# Patient Record
Sex: Female | Born: 1970 | Race: White | Hispanic: No | Marital: Single | Smoking: Current every day smoker
Health system: Southern US, Community
[De-identification: ages and names within clinical notes are randomized; demographics above are authoritative.]

## PROBLEM LIST (undated history)

## (undated) DIAGNOSIS — F419 Anxiety disorder, unspecified: Secondary | ICD-10-CM

## (undated) DIAGNOSIS — S2249XA Multiple fractures of ribs, unspecified side, initial encounter for closed fracture: Secondary | ICD-10-CM

## (undated) DIAGNOSIS — F32A Depression, unspecified: Secondary | ICD-10-CM

## (undated) DIAGNOSIS — G8929 Other chronic pain: Secondary | ICD-10-CM

## (undated) DIAGNOSIS — M549 Dorsalgia, unspecified: Secondary | ICD-10-CM

## (undated) DIAGNOSIS — S02609A Fracture of mandible, unspecified, initial encounter for closed fracture: Secondary | ICD-10-CM

## (undated) DIAGNOSIS — M79606 Pain in leg, unspecified: Secondary | ICD-10-CM

## (undated) DIAGNOSIS — R569 Unspecified convulsions: Secondary | ICD-10-CM

## (undated) DIAGNOSIS — S2239XA Fracture of one rib, unspecified side, initial encounter for closed fracture: Secondary | ICD-10-CM

## (undated) DIAGNOSIS — IMO0002 Reserved for concepts with insufficient information to code with codable children: Secondary | ICD-10-CM

## (undated) DIAGNOSIS — F329 Major depressive disorder, single episode, unspecified: Secondary | ICD-10-CM

## (undated) DIAGNOSIS — F4310 Post-traumatic stress disorder, unspecified: Secondary | ICD-10-CM

## (undated) HISTORY — PX: OTHER SURGICAL HISTORY: SHX169

---

## 1998-04-05 ENCOUNTER — Emergency Department (HOSPITAL_COMMUNITY): Admission: EM | Admit: 1998-04-05 | Discharge: 1998-04-05 | Payer: Self-pay | Admitting: Emergency Medicine

## 1998-04-06 ENCOUNTER — Emergency Department (HOSPITAL_COMMUNITY): Admission: EM | Admit: 1998-04-06 | Discharge: 1998-04-06 | Payer: Self-pay | Admitting: Emergency Medicine

## 1998-05-07 ENCOUNTER — Other Ambulatory Visit: Admission: RE | Admit: 1998-05-07 | Discharge: 1998-05-07 | Payer: Self-pay | Admitting: Obstetrics

## 1998-05-07 ENCOUNTER — Encounter: Admission: RE | Admit: 1998-05-07 | Discharge: 1998-05-07 | Payer: Self-pay | Admitting: Obstetrics

## 1998-06-03 ENCOUNTER — Inpatient Hospital Stay (HOSPITAL_COMMUNITY): Admission: AD | Admit: 1998-06-03 | Discharge: 1998-06-03 | Payer: Self-pay | Admitting: Obstetrics

## 1998-06-14 ENCOUNTER — Emergency Department (HOSPITAL_COMMUNITY): Admission: EM | Admit: 1998-06-14 | Discharge: 1998-06-14 | Payer: Self-pay | Admitting: Emergency Medicine

## 1998-06-24 ENCOUNTER — Emergency Department (HOSPITAL_COMMUNITY): Admission: EM | Admit: 1998-06-24 | Discharge: 1998-06-24 | Payer: Self-pay | Admitting: Emergency Medicine

## 1998-06-26 ENCOUNTER — Emergency Department (HOSPITAL_COMMUNITY): Admission: EM | Admit: 1998-06-26 | Discharge: 1998-06-26 | Payer: Self-pay | Admitting: Emergency Medicine

## 1998-06-30 ENCOUNTER — Emergency Department (HOSPITAL_COMMUNITY): Admission: EM | Admit: 1998-06-30 | Discharge: 1998-06-30 | Payer: Self-pay | Admitting: Emergency Medicine

## 1998-07-05 ENCOUNTER — Emergency Department (HOSPITAL_COMMUNITY): Admission: EM | Admit: 1998-07-05 | Discharge: 1998-07-05 | Payer: Self-pay | Admitting: Emergency Medicine

## 1998-07-12 ENCOUNTER — Emergency Department (HOSPITAL_COMMUNITY): Admission: EM | Admit: 1998-07-12 | Discharge: 1998-07-12 | Payer: Self-pay

## 1998-07-13 ENCOUNTER — Emergency Department (HOSPITAL_COMMUNITY): Admission: EM | Admit: 1998-07-13 | Discharge: 1998-07-13 | Payer: Self-pay | Admitting: Emergency Medicine

## 1998-08-26 ENCOUNTER — Encounter: Admission: RE | Admit: 1998-08-26 | Discharge: 1998-08-26 | Payer: Self-pay | Admitting: Family Medicine

## 1998-09-23 ENCOUNTER — Emergency Department (HOSPITAL_COMMUNITY): Admission: EM | Admit: 1998-09-23 | Discharge: 1998-09-23 | Payer: Self-pay | Admitting: Emergency Medicine

## 1998-09-29 ENCOUNTER — Inpatient Hospital Stay (HOSPITAL_COMMUNITY): Admission: EM | Admit: 1998-09-29 | Discharge: 1998-10-01 | Payer: Self-pay | Admitting: Emergency Medicine

## 1998-10-13 ENCOUNTER — Encounter: Admission: RE | Admit: 1998-10-13 | Discharge: 1998-10-13 | Payer: Self-pay | Admitting: Family Medicine

## 1998-10-26 ENCOUNTER — Encounter: Admission: RE | Admit: 1998-10-26 | Discharge: 1998-10-26 | Payer: Self-pay | Admitting: Family Medicine

## 1998-11-02 ENCOUNTER — Encounter: Admission: RE | Admit: 1998-11-02 | Discharge: 1998-11-02 | Payer: Self-pay | Admitting: Family Medicine

## 1998-11-04 ENCOUNTER — Emergency Department (HOSPITAL_COMMUNITY): Admission: EM | Admit: 1998-11-04 | Discharge: 1998-11-04 | Payer: Self-pay | Admitting: Emergency Medicine

## 1998-12-07 ENCOUNTER — Encounter: Admission: RE | Admit: 1998-12-07 | Discharge: 1998-12-07 | Payer: Self-pay | Admitting: Family Medicine

## 1998-12-16 ENCOUNTER — Encounter: Admission: RE | Admit: 1998-12-16 | Discharge: 1998-12-16 | Payer: Self-pay | Admitting: Sports Medicine

## 1998-12-22 ENCOUNTER — Emergency Department (HOSPITAL_COMMUNITY): Admission: EM | Admit: 1998-12-22 | Discharge: 1998-12-23 | Payer: Self-pay | Admitting: Emergency Medicine

## 1999-01-03 ENCOUNTER — Emergency Department (HOSPITAL_COMMUNITY): Admission: EM | Admit: 1999-01-03 | Discharge: 1999-01-04 | Payer: Self-pay | Admitting: Internal Medicine

## 1999-03-02 ENCOUNTER — Encounter: Admission: RE | Admit: 1999-03-02 | Discharge: 1999-03-02 | Payer: Self-pay | Admitting: Sports Medicine

## 1999-03-05 ENCOUNTER — Emergency Department (HOSPITAL_COMMUNITY): Admission: EM | Admit: 1999-03-05 | Discharge: 1999-03-06 | Payer: Self-pay | Admitting: Emergency Medicine

## 1999-03-10 ENCOUNTER — Emergency Department (HOSPITAL_COMMUNITY): Admission: EM | Admit: 1999-03-10 | Discharge: 1999-03-10 | Payer: Self-pay | Admitting: *Deleted

## 1999-03-13 ENCOUNTER — Emergency Department (HOSPITAL_COMMUNITY): Admission: EM | Admit: 1999-03-13 | Discharge: 1999-03-13 | Payer: Self-pay | Admitting: Emergency Medicine

## 1999-03-18 ENCOUNTER — Encounter: Admission: RE | Admit: 1999-03-18 | Discharge: 1999-03-18 | Payer: Self-pay | Admitting: Family Medicine

## 1999-04-15 ENCOUNTER — Emergency Department (HOSPITAL_COMMUNITY): Admission: EM | Admit: 1999-04-15 | Discharge: 1999-04-15 | Payer: Self-pay | Admitting: *Deleted

## 1999-04-22 ENCOUNTER — Emergency Department (HOSPITAL_COMMUNITY): Admission: EM | Admit: 1999-04-22 | Discharge: 1999-04-22 | Payer: Self-pay | Admitting: Emergency Medicine

## 1999-05-17 ENCOUNTER — Encounter: Admission: RE | Admit: 1999-05-17 | Discharge: 1999-05-17 | Payer: Self-pay | Admitting: Family Medicine

## 1999-05-26 ENCOUNTER — Encounter: Admission: RE | Admit: 1999-05-26 | Discharge: 1999-05-26 | Payer: Self-pay | Admitting: Family Medicine

## 1999-06-16 ENCOUNTER — Encounter: Payer: Self-pay | Admitting: Emergency Medicine

## 1999-06-16 ENCOUNTER — Emergency Department (HOSPITAL_COMMUNITY): Admission: EM | Admit: 1999-06-16 | Discharge: 1999-06-16 | Payer: Self-pay | Admitting: Emergency Medicine

## 1999-08-03 ENCOUNTER — Encounter: Admission: RE | Admit: 1999-08-03 | Discharge: 1999-08-03 | Payer: Self-pay | Admitting: Family Medicine

## 1999-08-04 ENCOUNTER — Encounter: Admission: RE | Admit: 1999-08-04 | Discharge: 1999-08-04 | Payer: Self-pay | Admitting: Family Medicine

## 1999-08-05 ENCOUNTER — Encounter: Admission: RE | Admit: 1999-08-05 | Discharge: 1999-08-05 | Payer: Self-pay | Admitting: Family Medicine

## 1999-08-09 ENCOUNTER — Encounter: Admission: RE | Admit: 1999-08-09 | Discharge: 1999-08-09 | Payer: Self-pay | Admitting: Family Medicine

## 1999-08-30 ENCOUNTER — Emergency Department (HOSPITAL_COMMUNITY): Admission: EM | Admit: 1999-08-30 | Discharge: 1999-08-30 | Payer: Self-pay | Admitting: Emergency Medicine

## 1999-09-02 ENCOUNTER — Encounter: Payer: Self-pay | Admitting: Family Medicine

## 1999-09-02 ENCOUNTER — Inpatient Hospital Stay (HOSPITAL_COMMUNITY): Admission: EM | Admit: 1999-09-02 | Discharge: 1999-09-04 | Payer: Self-pay | Admitting: Emergency Medicine

## 1999-09-28 ENCOUNTER — Encounter: Admission: RE | Admit: 1999-09-28 | Discharge: 1999-09-28 | Payer: Self-pay | Admitting: Family Medicine

## 1999-09-28 ENCOUNTER — Emergency Department (HOSPITAL_COMMUNITY): Admission: EM | Admit: 1999-09-28 | Discharge: 1999-09-29 | Payer: Self-pay | Admitting: Emergency Medicine

## 1999-10-04 ENCOUNTER — Emergency Department (HOSPITAL_COMMUNITY): Admission: EM | Admit: 1999-10-04 | Discharge: 1999-10-04 | Payer: Self-pay | Admitting: Emergency Medicine

## 1999-10-20 ENCOUNTER — Encounter: Payer: Self-pay | Admitting: Emergency Medicine

## 1999-10-20 ENCOUNTER — Emergency Department (HOSPITAL_COMMUNITY): Admission: EM | Admit: 1999-10-20 | Discharge: 1999-10-20 | Payer: Self-pay | Admitting: Emergency Medicine

## 1999-10-24 ENCOUNTER — Emergency Department (HOSPITAL_COMMUNITY): Admission: EM | Admit: 1999-10-24 | Discharge: 1999-10-24 | Payer: Self-pay | Admitting: *Deleted

## 1999-11-19 ENCOUNTER — Emergency Department (HOSPITAL_COMMUNITY): Admission: EM | Admit: 1999-11-19 | Discharge: 1999-11-19 | Payer: Self-pay

## 1999-11-19 ENCOUNTER — Inpatient Hospital Stay (HOSPITAL_COMMUNITY): Admission: AD | Admit: 1999-11-19 | Discharge: 1999-11-19 | Payer: Self-pay | Admitting: *Deleted

## 1999-11-28 ENCOUNTER — Emergency Department (HOSPITAL_COMMUNITY): Admission: EM | Admit: 1999-11-28 | Discharge: 1999-11-28 | Payer: Self-pay | Admitting: Emergency Medicine

## 1999-11-28 ENCOUNTER — Encounter: Payer: Self-pay | Admitting: Emergency Medicine

## 1999-12-09 ENCOUNTER — Encounter: Admission: RE | Admit: 1999-12-09 | Discharge: 1999-12-09 | Payer: Self-pay | Admitting: Family Medicine

## 1999-12-10 ENCOUNTER — Emergency Department (HOSPITAL_COMMUNITY): Admission: EM | Admit: 1999-12-10 | Discharge: 1999-12-10 | Payer: Self-pay | Admitting: Emergency Medicine

## 1999-12-11 ENCOUNTER — Emergency Department (HOSPITAL_COMMUNITY): Admission: EM | Admit: 1999-12-11 | Discharge: 1999-12-11 | Payer: Self-pay

## 1999-12-17 ENCOUNTER — Encounter: Admission: RE | Admit: 1999-12-17 | Discharge: 1999-12-17 | Payer: Self-pay | Admitting: Sports Medicine

## 1999-12-23 ENCOUNTER — Emergency Department (HOSPITAL_COMMUNITY): Admission: EM | Admit: 1999-12-23 | Discharge: 1999-12-23 | Payer: Self-pay | Admitting: Emergency Medicine

## 2000-01-10 ENCOUNTER — Emergency Department (HOSPITAL_COMMUNITY): Admission: EM | Admit: 2000-01-10 | Discharge: 2000-01-10 | Payer: Self-pay | Admitting: Emergency Medicine

## 2000-01-13 ENCOUNTER — Encounter: Admission: RE | Admit: 2000-01-13 | Discharge: 2000-01-13 | Payer: Self-pay | Admitting: Family Medicine

## 2000-01-18 ENCOUNTER — Encounter: Admission: RE | Admit: 2000-01-18 | Discharge: 2000-01-18 | Payer: Self-pay | Admitting: Sports Medicine

## 2000-01-20 ENCOUNTER — Emergency Department (HOSPITAL_COMMUNITY): Admission: EM | Admit: 2000-01-20 | Discharge: 2000-01-20 | Payer: Self-pay | Admitting: Emergency Medicine

## 2000-01-26 ENCOUNTER — Emergency Department (HOSPITAL_COMMUNITY): Admission: EM | Admit: 2000-01-26 | Discharge: 2000-01-26 | Payer: Self-pay | Admitting: Emergency Medicine

## 2000-02-10 ENCOUNTER — Emergency Department (HOSPITAL_COMMUNITY): Admission: EM | Admit: 2000-02-10 | Discharge: 2000-02-10 | Payer: Self-pay | Admitting: Emergency Medicine

## 2000-02-17 ENCOUNTER — Emergency Department (HOSPITAL_COMMUNITY): Admission: EM | Admit: 2000-02-17 | Discharge: 2000-02-17 | Payer: Self-pay | Admitting: Emergency Medicine

## 2000-02-20 ENCOUNTER — Emergency Department (HOSPITAL_COMMUNITY): Admission: EM | Admit: 2000-02-20 | Discharge: 2000-02-20 | Payer: Self-pay | Admitting: Emergency Medicine

## 2000-03-29 ENCOUNTER — Emergency Department (HOSPITAL_COMMUNITY): Admission: EM | Admit: 2000-03-29 | Discharge: 2000-03-29 | Payer: Self-pay | Admitting: Emergency Medicine

## 2000-05-03 ENCOUNTER — Encounter: Admission: RE | Admit: 2000-05-03 | Discharge: 2000-05-03 | Payer: Self-pay | Admitting: Family Medicine

## 2000-05-05 ENCOUNTER — Encounter: Payer: Self-pay | Admitting: Sports Medicine

## 2000-05-05 ENCOUNTER — Encounter: Admission: RE | Admit: 2000-05-05 | Discharge: 2000-05-05 | Payer: Self-pay | Admitting: Sports Medicine

## 2000-05-22 ENCOUNTER — Emergency Department (HOSPITAL_COMMUNITY): Admission: EM | Admit: 2000-05-22 | Discharge: 2000-05-22 | Payer: Self-pay | Admitting: *Deleted

## 2000-05-22 ENCOUNTER — Emergency Department (HOSPITAL_COMMUNITY): Admission: EM | Admit: 2000-05-22 | Discharge: 2000-05-22 | Payer: Self-pay | Admitting: Internal Medicine

## 2000-05-25 ENCOUNTER — Emergency Department (HOSPITAL_COMMUNITY): Admission: EM | Admit: 2000-05-25 | Discharge: 2000-05-25 | Payer: Self-pay | Admitting: Emergency Medicine

## 2001-05-27 ENCOUNTER — Emergency Department (HOSPITAL_COMMUNITY): Admission: EM | Admit: 2001-05-27 | Discharge: 2001-05-27 | Payer: Self-pay | Admitting: *Deleted

## 2001-05-29 ENCOUNTER — Emergency Department (HOSPITAL_COMMUNITY): Admission: EM | Admit: 2001-05-29 | Discharge: 2001-05-29 | Payer: Self-pay | Admitting: *Deleted

## 2001-06-06 ENCOUNTER — Emergency Department (HOSPITAL_COMMUNITY): Admission: EM | Admit: 2001-06-06 | Discharge: 2001-06-06 | Payer: Self-pay

## 2001-06-15 ENCOUNTER — Ambulatory Visit (HOSPITAL_COMMUNITY): Admission: RE | Admit: 2001-06-15 | Discharge: 2001-06-15 | Payer: Self-pay | Admitting: Surgery

## 2001-06-15 ENCOUNTER — Emergency Department (HOSPITAL_COMMUNITY): Admission: EM | Admit: 2001-06-15 | Discharge: 2001-06-15 | Payer: Self-pay | Admitting: Emergency Medicine

## 2001-06-26 ENCOUNTER — Encounter: Payer: Self-pay | Admitting: Emergency Medicine

## 2001-06-26 ENCOUNTER — Emergency Department (HOSPITAL_COMMUNITY): Admission: EM | Admit: 2001-06-26 | Discharge: 2001-06-26 | Payer: Self-pay | Admitting: Emergency Medicine

## 2001-07-11 ENCOUNTER — Emergency Department (HOSPITAL_COMMUNITY): Admission: EM | Admit: 2001-07-11 | Discharge: 2001-07-12 | Payer: Self-pay

## 2001-07-12 ENCOUNTER — Encounter: Payer: Self-pay | Admitting: Emergency Medicine

## 2001-07-22 ENCOUNTER — Encounter: Payer: Self-pay | Admitting: Emergency Medicine

## 2001-07-22 ENCOUNTER — Emergency Department (HOSPITAL_COMMUNITY): Admission: EM | Admit: 2001-07-22 | Discharge: 2001-07-22 | Payer: Self-pay | Admitting: Emergency Medicine

## 2001-07-28 ENCOUNTER — Encounter: Payer: Self-pay | Admitting: Emergency Medicine

## 2001-07-28 ENCOUNTER — Emergency Department (HOSPITAL_COMMUNITY): Admission: EM | Admit: 2001-07-28 | Discharge: 2001-07-28 | Payer: Self-pay | Admitting: Emergency Medicine

## 2001-08-01 ENCOUNTER — Emergency Department (HOSPITAL_COMMUNITY): Admission: EM | Admit: 2001-08-01 | Discharge: 2001-08-01 | Payer: Self-pay | Admitting: Emergency Medicine

## 2001-09-07 ENCOUNTER — Emergency Department (HOSPITAL_COMMUNITY): Admission: EM | Admit: 2001-09-07 | Discharge: 2001-09-07 | Payer: Self-pay | Admitting: Emergency Medicine

## 2001-09-08 ENCOUNTER — Emergency Department (HOSPITAL_COMMUNITY): Admission: EM | Admit: 2001-09-08 | Discharge: 2001-09-08 | Payer: Self-pay | Admitting: Emergency Medicine

## 2001-09-10 ENCOUNTER — Emergency Department (HOSPITAL_COMMUNITY): Admission: EM | Admit: 2001-09-10 | Discharge: 2001-09-10 | Payer: Self-pay

## 2001-09-10 ENCOUNTER — Emergency Department (HOSPITAL_COMMUNITY): Admission: EM | Admit: 2001-09-10 | Discharge: 2001-09-10 | Payer: Self-pay | Admitting: Emergency Medicine

## 2001-09-28 ENCOUNTER — Emergency Department (HOSPITAL_COMMUNITY): Admission: EM | Admit: 2001-09-28 | Discharge: 2001-09-28 | Payer: Self-pay | Admitting: *Deleted

## 2001-09-28 ENCOUNTER — Encounter: Payer: Self-pay | Admitting: *Deleted

## 2001-10-08 ENCOUNTER — Emergency Department (HOSPITAL_COMMUNITY): Admission: EM | Admit: 2001-10-08 | Discharge: 2001-10-08 | Payer: Self-pay | Admitting: Emergency Medicine

## 2001-10-15 ENCOUNTER — Emergency Department (HOSPITAL_COMMUNITY): Admission: EM | Admit: 2001-10-15 | Discharge: 2001-10-15 | Payer: Self-pay | Admitting: *Deleted

## 2001-11-04 ENCOUNTER — Emergency Department (HOSPITAL_COMMUNITY): Admission: EM | Admit: 2001-11-04 | Discharge: 2001-11-04 | Payer: Self-pay | Admitting: Emergency Medicine

## 2001-11-12 ENCOUNTER — Encounter: Payer: Self-pay | Admitting: Emergency Medicine

## 2001-11-12 ENCOUNTER — Emergency Department (HOSPITAL_COMMUNITY): Admission: EM | Admit: 2001-11-12 | Discharge: 2001-11-12 | Payer: Self-pay | Admitting: Emergency Medicine

## 2001-11-26 ENCOUNTER — Emergency Department (HOSPITAL_COMMUNITY): Admission: EM | Admit: 2001-11-26 | Discharge: 2001-11-26 | Payer: Self-pay | Admitting: Emergency Medicine

## 2001-12-04 ENCOUNTER — Encounter: Admission: RE | Admit: 2001-12-04 | Discharge: 2001-12-04 | Payer: Self-pay | Admitting: Family Medicine

## 2001-12-06 ENCOUNTER — Encounter: Admission: RE | Admit: 2001-12-06 | Discharge: 2001-12-06 | Payer: Self-pay | Admitting: Family Medicine

## 2001-12-11 ENCOUNTER — Emergency Department (HOSPITAL_COMMUNITY): Admission: EM | Admit: 2001-12-11 | Discharge: 2001-12-11 | Payer: Self-pay | Admitting: Emergency Medicine

## 2002-01-07 ENCOUNTER — Encounter: Admission: RE | Admit: 2002-01-07 | Discharge: 2002-01-07 | Payer: Self-pay | Admitting: Family Medicine

## 2002-01-28 ENCOUNTER — Emergency Department (HOSPITAL_COMMUNITY): Admission: EM | Admit: 2002-01-28 | Discharge: 2002-01-28 | Payer: Self-pay | Admitting: Emergency Medicine

## 2002-02-08 ENCOUNTER — Encounter: Payer: Self-pay | Admitting: Emergency Medicine

## 2002-02-08 ENCOUNTER — Emergency Department (HOSPITAL_COMMUNITY): Admission: EM | Admit: 2002-02-08 | Discharge: 2002-02-08 | Payer: Self-pay | Admitting: Emergency Medicine

## 2002-03-12 ENCOUNTER — Emergency Department (HOSPITAL_COMMUNITY): Admission: EM | Admit: 2002-03-12 | Discharge: 2002-03-12 | Payer: Self-pay | Admitting: Emergency Medicine

## 2002-06-14 ENCOUNTER — Encounter: Admission: RE | Admit: 2002-06-14 | Discharge: 2002-06-14 | Payer: Self-pay | Admitting: Family Medicine

## 2002-07-04 ENCOUNTER — Encounter: Admission: RE | Admit: 2002-07-04 | Discharge: 2002-07-04 | Payer: Self-pay | Admitting: Family Medicine

## 2002-07-05 ENCOUNTER — Emergency Department (HOSPITAL_COMMUNITY): Admission: EM | Admit: 2002-07-05 | Discharge: 2002-07-05 | Payer: Self-pay | Admitting: Emergency Medicine

## 2002-08-05 ENCOUNTER — Encounter: Admission: RE | Admit: 2002-08-05 | Discharge: 2002-08-05 | Payer: Self-pay | Admitting: Family Medicine

## 2002-08-12 ENCOUNTER — Encounter: Admission: RE | Admit: 2002-08-12 | Discharge: 2002-08-12 | Payer: Self-pay | Admitting: Family Medicine

## 2002-08-16 ENCOUNTER — Encounter: Admission: RE | Admit: 2002-08-16 | Discharge: 2002-08-16 | Payer: Self-pay | Admitting: Family Medicine

## 2002-08-27 ENCOUNTER — Emergency Department (HOSPITAL_COMMUNITY): Admission: EM | Admit: 2002-08-27 | Discharge: 2002-08-27 | Payer: Self-pay | Admitting: Emergency Medicine

## 2002-09-24 ENCOUNTER — Inpatient Hospital Stay (HOSPITAL_COMMUNITY): Admission: EM | Admit: 2002-09-24 | Discharge: 2002-10-01 | Payer: Self-pay | Admitting: Psychiatry

## 2002-09-24 ENCOUNTER — Encounter: Payer: Self-pay | Admitting: Emergency Medicine

## 2002-10-08 ENCOUNTER — Emergency Department (HOSPITAL_COMMUNITY): Admission: EM | Admit: 2002-10-08 | Discharge: 2002-10-08 | Payer: Self-pay | Admitting: Emergency Medicine

## 2002-10-09 ENCOUNTER — Ambulatory Visit (HOSPITAL_COMMUNITY): Admission: RE | Admit: 2002-10-09 | Discharge: 2002-10-09 | Payer: Self-pay | Admitting: Emergency Medicine

## 2002-10-09 ENCOUNTER — Encounter: Payer: Self-pay | Admitting: Emergency Medicine

## 2002-10-13 ENCOUNTER — Emergency Department (HOSPITAL_COMMUNITY): Admission: EM | Admit: 2002-10-13 | Discharge: 2002-10-13 | Payer: Self-pay | Admitting: Emergency Medicine

## 2002-10-18 ENCOUNTER — Emergency Department (HOSPITAL_COMMUNITY): Admission: EM | Admit: 2002-10-18 | Discharge: 2002-10-18 | Payer: Self-pay

## 2002-10-20 ENCOUNTER — Encounter: Payer: Self-pay | Admitting: Obstetrics and Gynecology

## 2002-10-20 ENCOUNTER — Inpatient Hospital Stay (HOSPITAL_COMMUNITY): Admission: AD | Admit: 2002-10-20 | Discharge: 2002-10-20 | Payer: Self-pay | Admitting: Obstetrics and Gynecology

## 2002-10-27 ENCOUNTER — Emergency Department (HOSPITAL_COMMUNITY): Admission: EM | Admit: 2002-10-27 | Discharge: 2002-10-27 | Payer: Self-pay | Admitting: Emergency Medicine

## 2002-11-01 ENCOUNTER — Emergency Department (HOSPITAL_COMMUNITY): Admission: EM | Admit: 2002-11-01 | Discharge: 2002-11-01 | Payer: Self-pay | Admitting: Emergency Medicine

## 2005-05-02 ENCOUNTER — Emergency Department (HOSPITAL_COMMUNITY): Admission: EM | Admit: 2005-05-02 | Discharge: 2005-05-02 | Payer: Self-pay | Admitting: *Deleted

## 2005-05-19 ENCOUNTER — Ambulatory Visit: Payer: Self-pay | Admitting: Family Medicine

## 2005-06-14 ENCOUNTER — Ambulatory Visit: Payer: Self-pay | Admitting: Family Medicine

## 2005-07-12 ENCOUNTER — Ambulatory Visit: Payer: Self-pay | Admitting: Family Medicine

## 2005-08-30 ENCOUNTER — Ambulatory Visit: Payer: Self-pay | Admitting: Family Medicine

## 2005-08-31 ENCOUNTER — Emergency Department (HOSPITAL_COMMUNITY): Admission: EM | Admit: 2005-08-31 | Discharge: 2005-08-31 | Payer: Self-pay | Admitting: Emergency Medicine

## 2005-08-31 ENCOUNTER — Ambulatory Visit: Payer: Self-pay | Admitting: *Deleted

## 2005-09-22 ENCOUNTER — Emergency Department (HOSPITAL_COMMUNITY): Admission: EM | Admit: 2005-09-22 | Discharge: 2005-09-22 | Payer: Self-pay | Admitting: Emergency Medicine

## 2005-10-01 ENCOUNTER — Emergency Department (HOSPITAL_COMMUNITY): Admission: EM | Admit: 2005-10-01 | Discharge: 2005-10-01 | Payer: Self-pay | Admitting: Emergency Medicine

## 2005-11-05 ENCOUNTER — Emergency Department (HOSPITAL_COMMUNITY): Admission: EM | Admit: 2005-11-05 | Discharge: 2005-11-05 | Payer: Self-pay | Admitting: Emergency Medicine

## 2005-11-05 ENCOUNTER — Inpatient Hospital Stay (HOSPITAL_COMMUNITY): Admission: AD | Admit: 2005-11-05 | Discharge: 2005-11-09 | Payer: Self-pay | Admitting: Psychiatry

## 2005-11-06 ENCOUNTER — Ambulatory Visit: Payer: Self-pay | Admitting: Psychiatry

## 2005-12-06 ENCOUNTER — Emergency Department (HOSPITAL_COMMUNITY): Admission: EM | Admit: 2005-12-06 | Discharge: 2005-12-06 | Payer: Self-pay | Admitting: Emergency Medicine

## 2006-01-09 ENCOUNTER — Emergency Department (HOSPITAL_COMMUNITY): Admission: EM | Admit: 2006-01-09 | Discharge: 2006-01-10 | Payer: Self-pay | Admitting: Emergency Medicine

## 2006-02-09 ENCOUNTER — Emergency Department (HOSPITAL_COMMUNITY): Admission: EM | Admit: 2006-02-09 | Discharge: 2006-02-09 | Payer: Self-pay | Admitting: Emergency Medicine

## 2006-02-23 ENCOUNTER — Emergency Department (HOSPITAL_COMMUNITY): Admission: EM | Admit: 2006-02-23 | Discharge: 2006-02-23 | Payer: Self-pay | Admitting: Emergency Medicine

## 2006-03-02 ENCOUNTER — Emergency Department (HOSPITAL_COMMUNITY): Admission: EM | Admit: 2006-03-02 | Discharge: 2006-03-02 | Payer: Self-pay | Admitting: Emergency Medicine

## 2006-03-03 ENCOUNTER — Emergency Department (HOSPITAL_COMMUNITY): Admission: EM | Admit: 2006-03-03 | Discharge: 2006-03-03 | Payer: Self-pay | Admitting: Emergency Medicine

## 2006-03-05 ENCOUNTER — Emergency Department (HOSPITAL_COMMUNITY): Admission: EM | Admit: 2006-03-05 | Discharge: 2006-03-05 | Payer: Self-pay | Admitting: Emergency Medicine

## 2006-03-20 ENCOUNTER — Emergency Department (HOSPITAL_COMMUNITY): Admission: EM | Admit: 2006-03-20 | Discharge: 2006-03-20 | Payer: Self-pay | Admitting: Emergency Medicine

## 2006-03-21 ENCOUNTER — Emergency Department (HOSPITAL_COMMUNITY): Admission: EM | Admit: 2006-03-21 | Discharge: 2006-03-21 | Payer: Self-pay | Admitting: Emergency Medicine

## 2006-06-21 ENCOUNTER — Emergency Department (HOSPITAL_COMMUNITY): Admission: EM | Admit: 2006-06-21 | Discharge: 2006-06-21 | Payer: Self-pay | Admitting: Emergency Medicine

## 2006-07-11 ENCOUNTER — Emergency Department (HOSPITAL_COMMUNITY): Admission: EM | Admit: 2006-07-11 | Discharge: 2006-07-11 | Payer: Self-pay | Admitting: Family Medicine

## 2006-07-28 ENCOUNTER — Inpatient Hospital Stay (HOSPITAL_COMMUNITY): Admission: RE | Admit: 2006-07-28 | Discharge: 2006-08-05 | Payer: Self-pay | Admitting: Psychiatry

## 2006-07-28 ENCOUNTER — Ambulatory Visit: Payer: Self-pay | Admitting: Psychiatry

## 2006-07-28 ENCOUNTER — Encounter: Payer: Self-pay | Admitting: Emergency Medicine

## 2006-09-21 ENCOUNTER — Ambulatory Visit: Payer: Self-pay | Admitting: Psychiatry

## 2006-09-21 ENCOUNTER — Inpatient Hospital Stay (HOSPITAL_COMMUNITY): Admission: AD | Admit: 2006-09-21 | Discharge: 2006-09-28 | Payer: Self-pay | Admitting: Psychiatry

## 2006-12-06 ENCOUNTER — Emergency Department (HOSPITAL_COMMUNITY): Admission: EM | Admit: 2006-12-06 | Discharge: 2006-12-07 | Payer: Self-pay | Admitting: Emergency Medicine

## 2007-02-01 ENCOUNTER — Inpatient Hospital Stay (HOSPITAL_COMMUNITY): Admission: AD | Admit: 2007-02-01 | Discharge: 2007-02-06 | Payer: Self-pay | Admitting: Psychiatry

## 2007-02-01 ENCOUNTER — Ambulatory Visit: Payer: Self-pay | Admitting: Psychiatry

## 2007-08-13 ENCOUNTER — Telehealth (INDEPENDENT_AMBULATORY_CARE_PROVIDER_SITE_OTHER): Payer: Self-pay | Admitting: *Deleted

## 2007-09-03 ENCOUNTER — Encounter (INDEPENDENT_AMBULATORY_CARE_PROVIDER_SITE_OTHER): Payer: Self-pay | Admitting: Family Medicine

## 2008-02-17 ENCOUNTER — Inpatient Hospital Stay (HOSPITAL_COMMUNITY): Admission: EM | Admit: 2008-02-17 | Discharge: 2008-02-27 | Payer: Self-pay | Admitting: Emergency Medicine

## 2008-02-25 ENCOUNTER — Ambulatory Visit: Payer: Self-pay | Admitting: Psychiatry

## 2008-02-27 ENCOUNTER — Inpatient Hospital Stay (HOSPITAL_COMMUNITY): Admission: RE | Admit: 2008-02-27 | Discharge: 2008-03-06 | Payer: Self-pay | Admitting: Psychiatry

## 2008-02-28 ENCOUNTER — Ambulatory Visit: Payer: Self-pay | Admitting: Psychiatry

## 2008-08-21 ENCOUNTER — Emergency Department (HOSPITAL_COMMUNITY): Admission: EM | Admit: 2008-08-21 | Discharge: 2008-08-21 | Payer: Self-pay | Admitting: Emergency Medicine

## 2009-11-04 ENCOUNTER — Emergency Department (HOSPITAL_COMMUNITY): Admission: EM | Admit: 2009-11-04 | Discharge: 2009-11-05 | Payer: Self-pay | Admitting: Emergency Medicine

## 2009-11-05 ENCOUNTER — Inpatient Hospital Stay (HOSPITAL_COMMUNITY): Admission: AD | Admit: 2009-11-05 | Discharge: 2009-11-10 | Payer: Self-pay | Admitting: Psychiatry

## 2009-11-05 ENCOUNTER — Ambulatory Visit: Payer: Self-pay | Admitting: Psychiatry

## 2009-12-19 ENCOUNTER — Ambulatory Visit: Payer: Self-pay | Admitting: Psychiatry

## 2009-12-19 ENCOUNTER — Inpatient Hospital Stay (HOSPITAL_COMMUNITY): Admission: RE | Admit: 2009-12-19 | Discharge: 2009-12-28 | Payer: Self-pay | Admitting: Psychiatry

## 2009-12-19 ENCOUNTER — Emergency Department (HOSPITAL_COMMUNITY): Admission: EM | Admit: 2009-12-19 | Discharge: 2009-12-19 | Payer: Self-pay | Admitting: Emergency Medicine

## 2009-12-31 ENCOUNTER — Emergency Department (HOSPITAL_COMMUNITY): Admission: EM | Admit: 2009-12-31 | Discharge: 2009-12-31 | Payer: Self-pay | Admitting: Emergency Medicine

## 2010-01-30 ENCOUNTER — Ambulatory Visit: Payer: Self-pay | Admitting: Psychiatry

## 2010-01-30 ENCOUNTER — Inpatient Hospital Stay (HOSPITAL_COMMUNITY): Admission: RE | Admit: 2010-01-30 | Discharge: 2010-02-04 | Payer: Self-pay | Admitting: Psychiatry

## 2010-01-30 ENCOUNTER — Emergency Department (HOSPITAL_COMMUNITY): Admission: EM | Admit: 2010-01-30 | Discharge: 2010-01-30 | Payer: Self-pay | Admitting: Emergency Medicine

## 2011-03-18 LAB — DIFFERENTIAL
Lymphocytes Relative: 19 % (ref 12–46)
Monocytes Absolute: 0.6 10*3/uL (ref 0.1–1.0)
Monocytes Relative: 5 % (ref 3–12)
Neutrophils Relative %: 73 % (ref 43–77)

## 2011-03-18 LAB — RAPID URINE DRUG SCREEN, HOSP PERFORMED
Barbiturates: NOT DETECTED
Benzodiazepines: POSITIVE — AB
Cocaine: NOT DETECTED
Opiates: NOT DETECTED
Tetrahydrocannabinol: POSITIVE — AB

## 2011-03-18 LAB — CBC
RBC: 3.98 MIL/uL (ref 3.87–5.11)
RDW: 15.6 % — ABNORMAL HIGH (ref 11.5–15.5)

## 2011-03-18 LAB — BASIC METABOLIC PANEL
BUN: 4 mg/dL — ABNORMAL LOW (ref 6–23)
Calcium: 8.8 mg/dL (ref 8.4–10.5)
Chloride: 107 mEq/L (ref 96–112)
GFR calc non Af Amer: >60 mL/min (ref >60)

## 2011-03-18 LAB — ETHANOL: Alcohol, Ethyl (B): <5 mg/dL (ref 0–10)

## 2011-03-28 LAB — BASIC METABOLIC PANEL
CO2: 24 mEq/L (ref 19–32)
Chloride: 109 mEq/L (ref 96–112)
Creatinine, Ser: 0.75 mg/dL (ref 0.4–1.2)
GFR calc Af Amer: >60 mL/min (ref >60)
Potassium: 3.6 mEq/L (ref 3.5–5.1)
Sodium: 139 mEq/L (ref 135–145)

## 2011-03-28 LAB — DIFFERENTIAL
Basophils Relative: 2 % — ABNORMAL HIGH (ref 0–1)
Eosinophils Absolute: 0.4 10*3/uL (ref 0.0–0.7)
Eosinophils Relative: 4 % (ref 0–5)
Lymphs Abs: 2.8 10*3/uL (ref 0.7–4.0)
Monocytes Absolute: 0.4 10*3/uL (ref 0.1–1.0)
Monocytes Relative: 5 % (ref 3–12)

## 2011-03-28 LAB — URINE MICROSCOPIC-ADD ON

## 2011-03-28 LAB — URINALYSIS, ROUTINE W REFLEX MICROSCOPIC
Ketones, ur: NEGATIVE mg/dL
Nitrite: NEGATIVE
Specific Gravity, Urine: 1.046 — ABNORMAL HIGH (ref 1.005–1.030)
Urobilinogen, UA: 0.2 mg/dL (ref 0.0–1.0)
pH: 5.5 (ref 5.0–8.0)

## 2011-03-28 LAB — CBC
HCT: 39 % (ref 36.0–46.0)
Hemoglobin: 13.1 g/dL (ref 12.0–15.0)
MCHC: 33.7 g/dL (ref 30.0–36.0)
MCV: 99 fL (ref 78.0–100.0)
RBC: 3.94 MIL/uL (ref 3.87–5.11)

## 2011-03-28 LAB — RAPID URINE DRUG SCREEN, HOSP PERFORMED
Cocaine: NOT DETECTED
Tetrahydrocannabinol: POSITIVE — AB

## 2011-03-30 LAB — BASIC METABOLIC PANEL
CO2: 27 mEq/L (ref 19–32)
Calcium: 8.5 mg/dL (ref 8.4–10.5)
Chloride: 107 mEq/L (ref 96–112)
Creatinine, Ser: 0.8 mg/dL (ref 0.4–1.2)
Glucose, Bld: 83 mg/dL (ref 70–99)

## 2011-03-30 LAB — RAPID URINE DRUG SCREEN, HOSP PERFORMED
Cocaine: NOT DETECTED
Opiates: NOT DETECTED
Tetrahydrocannabinol: POSITIVE — AB

## 2011-03-30 LAB — URINALYSIS, ROUTINE W REFLEX MICROSCOPIC
Bilirubin Urine: NEGATIVE
Nitrite: NEGATIVE
Specific Gravity, Urine: 1.027 (ref 1.005–1.030)
Urobilinogen, UA: 1 mg/dL (ref 0.0–1.0)
pH: 6 (ref 5.0–8.0)

## 2011-03-30 LAB — DIFFERENTIAL
Basophils Absolute: 0.2 10*3/uL — ABNORMAL HIGH (ref 0.0–0.1)
Basophils Relative: 2 % — ABNORMAL HIGH (ref 0–1)
Eosinophils Absolute: 0.2 10*3/uL (ref 0.0–0.7)
Monocytes Absolute: 0.4 10*3/uL (ref 0.1–1.0)
Monocytes Relative: 4 % (ref 3–12)
Neutro Abs: 5.8 10*3/uL (ref 1.7–7.7)
Neutrophils Relative %: 64 % (ref 43–77)

## 2011-03-30 LAB — URINE MICROSCOPIC-ADD ON

## 2011-03-30 LAB — CBC
MCHC: 35.4 g/dL (ref 30.0–36.0)
MCV: 97.1 fL (ref 78.0–100.0)
RDW: 14.9 % (ref 11.5–15.5)

## 2011-03-30 LAB — TRICYCLICS SCREEN, URINE: TCA Scrn: NOT DETECTED
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
## 2011-05-13 NOTE — Discharge Summary (Signed)
NAMECAMIE, [name] NO.:  [id number]   MEDICAL RECORD NO.:  [id number]          PATIENT TYPE:  IPS   LOCATION:  0301                          FACILITY:  [facility]   PHYSICIAN:  Geoffery Lyons, M.D.      DATE OF BIRTH:  [birth year]   DATE OF ADMISSION:  07/28/2006  DATE OF DISCHARGE:  08/05/2006                                 DISCHARGE SUMMARY   CHIEF COMPLAINT AND PRESENT ILLNESS:  This was the third admission to [facility] for this 40 year old white single female.  Presented  to the emergency department requesting detox from alcohol and Klonopin.  She  recognized that she had been increasing the amount of alcohol she was using,  preferring this over food.  Unable to stop drinking on her own.  Having a  lot of mood liability, episodes of anger, out of control, even during three  years sober.   PAST PSYCHIATRIC HISTORY:  Third time at [facility].  History of  polysubstance dependence, alcohol, benzodiazepines and opiates.   ALCOHOL/DRUG HISTORY:  As already stated, persistent use of substances, more  so recently alcohol and benzodiazepines.   MEDICAL HISTORY:  History of seizures, withdrawing from Xanax.   MEDICATIONS:  Got recent prescription for Klonopin.   PHYSICAL EXAMINATION:  Performed and failed to show any acute findings.   LABORATORY DATA:  CBC with white blood cells 10.7, hemoglobin 14.9.  Blood  chemistry with sodium 138, potassium 4.1, glucose 96, BUN 6, creatinine 0.8.  Drug screen positive for benzodiazepines, marijuana.   MENTAL STATUS EXAM:  Fully alert, pleasant, cooperative female.  Disheveled,  swollen hand as she put right hand through a window to get to the knob.  Speech normal in rate, tempo and production.  Mood depressed.  Affect  depressed.  Thought processes logical, coherent and relevant.  No delusions.  No active suicidal or homicidal ideation.  No hallucinations.  Cognition was  well-preserved.   ADMISSION  DIAGNOSES:  AXIS I:  Polysubstance dependence (alcohol,  benzodiazepines, marijuana).  Rule out substance-induced mood disorder.  AXIS II:  No diagnosis.  AXIS III:  Seizure secondary to benzodiazepine withdrawal, lacerated right  fifth metacarpal.  AXIS IV:  Moderate.  AXIS V:  GAF upon admission 30; highest GAF in the last year 60.   HOSPITAL COURSE:  She was admitted.  She was started in individual and group  psychotherapy.  She was detoxified with Librium.  She was given trazodone  for sleep.  She was given Seroquel as needed and naproxen.  She was  eventually started on Depakote and Paxil.  History of polysubstance  dependence.  Endorsed mood swings.  Once she is discharged from an inpatient  unit, she does not follow through.  She also admits that she feels she is  bipolar with mood swings, anger spells, not necessarily secondary to her  drug use, impulsive behavior, racing thoughts, hyperthymic, also endorsed  social anxiety, difficulty staying focused.  We continued to work with the  medication, Depakote for the mood fluctuation, Paxil for the depression and  the anxiety.  Endorsed difficulty with social anxiety, what she claimed kept  her from being able to work.  Endorsed that she was wanting to get her life  back together as she was wanting to be employed but she so far has not been  able to keep a job.  She endorsed episodes of increased anxiety, agitation,  some panic episodes with increased heart rate.  She was somewhat avoidant of  group activities, stayed to herself a lot in her room, did pretty well when  it was one-on-one interaction.  Aware that she was not going to be able to  get benzodiazepines.  Endorsed that the benzodiazepines did work for her but  admits that she used to abuse it.  By August 10th, she was sleeping through  the night.  Anxiety but that was better.  She was going to halfway house and  was going to [facility] to pursue long-term sobriety.   Was going to  refer to vocational rehab and she was going to get her medication adjusted.  By August 05, 2006, she was objectively better.  Endorsed that she was  sleeping through the night.  Anxiety was under much better control.  Affect  was brighter, broader.  She had been tolerating the medication well without  side effects, so we went ahead and discharged to outpatient follow-up.   DISCHARGE DIAGNOSES:  AXIS I:  Polysubstance dependence.  Mood disorder not  otherwise specified.  Social anxiety disorder.  Rule out attention-deficit  hyperactivity disorder.  AXIS II:  No diagnosis.  AXIS III:  Seizures secondary  to benzodiazepine withdrawal.  AXIS IV:  Moderate.  AXIS V:  GAF upon discharge 50-55.   DISCHARGE MEDICATIONS:  1. Depakote ER 250 mg, 1 twice a day and 1 at night.  2. Paxil CR 25 mg per day.  3. Seroquel 25 mg twice a day and 50 mg at night.  4. Trazodone 50 mg at bedtime for sleep.   FOLLOWUP:  [facility].      Geoffery Lyons, M.D.  Electronically Signed     IL/MEDQ  D:  08/16/2006  T:  08/16/2006  Job:  [id number]

## 2011-05-13 NOTE — Discharge Summary (Signed)
[name], [name] NO.:  [id number]   MEDICAL RECORD NO.:  [id number]          PATIENT TYPE:  IPS   LOCATION:  0507                          FACILITY:  [facility]   PHYSICIAN:  Anselm Jungling, MD  DATE OF BIRTH:  1971-08-28   DATE OF ADMISSION:  02/01/2007  DATE OF DISCHARGE:  02/06/2007                               DISCHARGE SUMMARY   IDENTIFYING DATA/REASON FOR ADMISSION:  This was an inpatient  psychiatric admission for [name], a 40 year old single white female who  came in for treatment of alcohol and benzodiazepine dependence.  Please  refer to the admission note for further details pertaining to the  symptoms, circumstances and history that led to her hospitalization.   INITIAL DIAGNOSTIC IMPRESSION:  She was given an initial AXIS I  diagnoses of alcohol and benzodiazepine dependence.   MEDICAL/LABORATORY:  The patient was medically and physically assessed  by the psychiatric nurse practitioner.  She had a skin lesion on her leg  which was cultured and discovered to be MRSA.  She was placed on Septra  Bactrim DS twice daily, with recommendations of a 10-day course.  She  was also given Bactroban ointment twice daily, and frequent dressing  changes.  Aside from this, there were no significant medical issues.  The patient did have some complaints of chronic pain and, for this,  Neurontin 100 mg t.i.d. proved to be somewhat helpful.   HOSPITAL COURSE:  The patient was admitted to the adult inpatient  psychiatric service.  She presented as a well-nourished, normally  developed female who was alert and fully oriented.  She complained of  feeling physically uncomfortable and ill during much of her hospital  stay.  She described feeling panicky as well.  She was placed on a  Librium withdrawal protocol for detoxification from alcohol and  benzodiazepines.  She requested pain medication for her back, and had  difficulty accepting that we could not prescribed her  benzodiazepines or  opiates.  She did utilize a trial of Lidoderm patch daily, which was  somewhat helpful.   The patient's detoxification proceeded relatively uneventfully.  As her  stay progressed, her participation improved, including participation in  12-step groups.  The patient requested a trial of Campral 666 mg t.i.d.  for alcohol cessation.  Risperdal, in low doses, was given on a p.r.n.  basis which was helpful for reducing her level of anxiety and agitation.  Depakote ER 500 mg at h.s. was also added to her regimen, as was Paxil  20 mg daily.  All of these were well-tolerated.   On the sixth hospital day, the patient appeared to be appropriate for  discharge and she agreed to the following aftercare plan.   AFTERCARE:  The patient was referred to [facility],  with an intake appointment on February 07, 2007.  [facility] was  also to refer the patient on to a psychiatrist for medication  management.   DISCHARGE MEDICATIONS:  1. Campral 666 mg t.i.d.  2. Lidoderm 5% patch daily to back.  3. Paxil 20 mg daily.  4.  Risperdal 0.5 mg b.i.d. and 0.5 mg q.h.s.  5. Claritin 10 mg daily.  6. Septra Bactrim DS twice daily through February 13, 2007.  7. Bactroban ointment twice daily.  8. Depakote ER 500 mg q.h.s.  9. Neurontin 100 mg t.i.d.   DISCHARGE DIAGNOSES:  AXIS I:  Alcohol and benzodiazepine dependence,  early remission.  Mood disorder not otherwise specified.  AXIS II:  AXIS III:  MRSA skin lesion, right leg.  AXIS IV:  Stressors:  Severe.  AXIS V:  GAF on discharge 65.      Anselm Jungling, MD  Electronically Signed     SPB/MEDQ  D:  02/11/2007  T:  02/11/2007  Job:  [id number]

## 2011-05-13 NOTE — H&P (Signed)
[name], SEDIVY NO.:  [id number]   MEDICAL RECORD NO.:  [id number]          PATIENT TYPE:  IPS   LOCATION:  0302                          FACILITY:  [facility]   PHYSICIAN:  Anselm Jungling, MD  DATE OF BIRTH:  [birth year]   DATE OF ADMISSION:  11/05/2005  DATE OF DISCHARGE:                         PSYCHIATRIC ADMISSION ASSESSMENT   This is a voluntary admission to the services of Dr. Geralyn Flash.   IDENTIFYING INFORMATION:  Apparently the patient presented to the [facility] Saturday morning about 6:30 in the morning.  She  stated that she was in depression.  She was angry.  She was having bad  thoughts.  Nightmares, no food for 2-1/2 months.  She stated that she had  been having diarrhea on and off for the past 3 months, and she vomits every  time she eats.  She stated that she had lost weight.  Later she stated she  had been more depressed over the last few weeks, just does not feel right.  States her mind is not right, states she has had suicidal thoughts and does  have a plan, although she did not want to tell the nurse in the presence of  other family members at which point she was referred to the ACT team.  The  patient today states that her biggest issue is that she has no energy.  She  has been buying a variety of opiates off the street, Percocet, Vicodin,  Lorcet tabs.  She discovered that this gave her energy to go do her job.  Hence basically she is opiate dependent at this point.   PAST PSYCHIATRIC HISTORY:  She had one prior admission here at [facility] approximately 1996.  She also states that she was in [facility] in  2000.  She is not followed on an outpatient basis at all.   SOCIAL HISTORY:  She has a GED.  She has never married.  She has a 16-year-  old son and a 103-year-old daughter who lives with her brother and her sister-  in-law.  She works at [employer].  She does have a plan to begin [employer]  on  November 23, 2005 to start courses as a Production designer, theatre/television/film if she can  get her mood back in order.   FAMILY HISTORY:  She states her mother has had depression with  hospitalizations.  Her father was an alcoholic.   ALCOHOL AND DRUG HISTORY:  She states that she began using alcohol at age  40.  She states that she uses alcohol intermittently.  Her alcohol level  last night was 25.  She states that she had had one beer.  She began using  marijuana at age 40 and for the past 5 months she has been using Percocet,  Vicodin, Lorcet, Lortab and codeine cough syrup on a daily basis.   PRIMARY CARE Meghna Hagmann:  She does not have one.   MEDICAL PROBLEMS:  There are no known problems.   MEDICATIONS:  None.   DRUG ALLERGIES:  None.   POSITIVE PHYSICAL FINDINGS:  She  was medically cleared in the emergency  room.  Her UDS was positive for benzos, for amphetamines, and for THC.  Her  potassium level was a little bit low at 3, and her alcohol level was 25.  Her other labs were unremarkable.  Her TSH is pending.  Her physical is as  per the ER.  Again other than being somewhat obese, which does not account  for her constant diarrhea and vomiting after every meal, she states that she  thinks she has lost about 20 pounds; however, it is not obvious.  Her vital  signs say she is 66 inches tall, weight 162, temperature 98.6, blood  pressure 114/75 and 106/72, pulse 96 to 106, respirations 18.   MENTAL STATUS EXAM:  She is drowsy.  She is casually groomed and dressed,  adequately nourished.  Her speech is not pressured.  Her mood is depressed  and anxious.  Her affect is congruent.  Thought processes are clear,  rational and goal-oriented.  She wants to get better.  However, she is  focused on getting Klonopin.  Judgment and insight are poor.  Concentration  and memory are intact.  Intelligence is at least average.  She continues to  have passive suicidal ideation.  This is references as if I don't  feel  better soon, I'm just never ever going to get out of this bed again.  She  is not homicidal.  She has no auditory or visual hallucinations.   ADMISSION DIAGNOSES:  AXIS I:  Mood disorder, not otherwise specified.  Polysubstance abuse/dependence, particularly opiates.  AXIS II:  Deferred.  AXIS III:  Overweight.  AXIS IV:  Moderate.  AXIS V:  33.   PLAN:  Admit for safety and stabilization.  Toward that end we started the  Wytensin protocol.  We also began Seroquel at h.s., and today we will start  Wellbutrin.  We will take up the issue regarding Klonopin once her system is  cleared out from the opiates.      Mickie Leonarda Salon, P.A.-C.      Anselm Jungling, MD  Electronically Signed    MD/MEDQ  D:  11/06/2005  T:  11/06/2005  Job:  [id number]

## 2011-05-13 NOTE — Op Note (Signed)
[facility]  Patient:    [name], [name]                      MRN: [id number] Proc. Date: 06/15/01 Adm. Date:  [id number] Disc. 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  PROCEDURE:  Laparoscopic ventral incisional hernia repair.  SURGEON:  Luretha Murphy, M.D.  ASSISTANT:  Carman Ching, M.D.  FINDINGS:  Incarcerated preperitoneal fat stuck up into small fenestrations in the midline incision with multiple small hernias.  DESCRIPTION OF PROCEDURE:  The patient was taken to room 11 at [facility] on Friday, June 15, 2001 and given general anesthesia. She received a gram of Ancef preop. The abdomen was prepped with Betadine and draped sterilely. I made an incision in the left lateral aspect and cut down bluntly and then inserted a Veress needle and insufflated the abdomen. We then used the 5 mm trocar with the zero degree scope and inserted this under vision with the scope going forward. Once entering, I then placed another port in the right lower quadrant and used that as a port for removing incarcerated fat up in the midline hernia. I had marked the hernia and pulled all this out revealing small holes in the midline incision.  I then decided to take a piece of 10 x 15 mesh using Gore-Tex. I marked the smooth side, rolled it up, anchored it along the longitudinal axis of the ellipse using Novofil sutures. I rolled this up. I made a little midline incision, went down into the hernia and used that as my port for introducing the mesh. The mesh was then introduced into the abdomen and with the finger we held pressure by controlling the hernia. The mesh was then unrolled and the two ends were grasped with endoclose  making small puncture incisions again at the superior and inferior aspects of the hernia. These were retrieved, pulled up, a mesh was unrolled such that the smooth side was facing the bowel and the rough side was facing the anterior abdominal wall. These were tied down and then rolled out and then tacked with the tacker. I did place a third 5 mm port on the right lateral side and this gave me excellent position for tacking and these were tacked around the circumference and in the middle to give good adherence of the mesh to the anterior abdominal wall. The patient seemed to tolerate the procedure well. I did take down adhesions in the left lower quadrant there and these were taken down bluntly. The wounds were all injected with Marcaine. The abdomen was deflated and the skin was closed with 4-0 Vicryl with Benzoin and Steri-Strips. The patient tolerated the procedure well. She will likely be discharged home following the procedure. DD:  06/15/01 TD:  06/15/01 Job: [id number]
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
## 2011-05-13 NOTE — Discharge Summary (Signed)
NAMENASTASIA, KAGE NO.:  [id number]   MEDICAL RECORD NO.:  [id number]          PATIENT TYPE:  IPS   LOCATION:  0302                          FACILITY:  BH   PHYSICIAN:  Geoffery Lyons, M.D.      DATE OF BIRTH:  03/21/1971   DATE OF ADMISSION:  11/05/2005  DATE OF DISCHARGE:  11/09/2005                                 DISCHARGE SUMMARY   CHIEF COMPLAINT/HISTORY OF PRESENT ILLNESS:  This was the second admission  to [facility] for this 40 year old female. She  presented to the [facility] Emergency Department endorsing depression,  angry.  She was angry, having bad thoughts, nightmares.  No food for 2-1/2  months, she claimed.  She had been having diarrhea on and off for the past 3  months, vomits every time she eats.  She has lost weight.  She states she  has been depressed over the last few weeks, claimed her mind was not right.  She had suicidal thoughts and does have a plan.  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  She claimed that her biggest  issue was that she did not have any energy, had been buying a variety of  opiates off the street, Percocet, Vicodin, Lorcet.  She claimed that these  gave her energy to do her job.   PAST PSYCHIATRIC HISTORY:  One prior admission to [facility]  in 1996.  She was in [place] in 2000.  No outpatient treatment.   ALCOHOL AND DRUG HISTORY:  Began using alcohol at age 73.  She has used  alcohol intermittently.  She began using marijuana at age 28, and for the  past 5 months she has been using Percocet, Vicodin, Lorcet, Lortab and  codeine as well as cough syrup.   MEDICAL HISTORY:  Noncontributory.   MEDICATIONS:  None.   PHYSICAL EXAMINATION:  Performed and failed to show any acute findings.   LABORATORY WORKUP:  TSH 2.627.  CBC white blood cells 7.4, hemoglobin 14.  Blood chemistries, sodium 140, potassium 3.0, glucose 88.  Liver enzymes,  SGOT  13, SGPT 11.  Drug screen positive for benzodiazepines, amphetamines  and for marijuana.   MENTAL STATUS EXAM:  Reveals an alert, cooperative female causally groomed  and dressed.  Speech was not pressured.  Mood was depressed and anxious.  Affect was congruent.  Thought processes were clear, rational and goal-  oriented.  Endorsed she wanted to get better.  However, she was focused on  getting Klonopin.  Judgment and insight were poor.  She endorsed passive  suicidal ideation, stating that if she did not get to feel better she was  not going to ever get out of that bed again.   ADMISSION DIAGNOSES:  AXIS I:  Mood disorder, not otherwise specified.  Polysubstance abuse.  AXIS II:  No diagnosis.  AXIS III:  Overweight.  AXIS IV:  Moderate.  AXIS V:  Upon admission 33, highest global assessment of function in the  last year 60.   COURSE IN  HOSPITAL:  She was admitted.  She was started in individual and  group psychotherapy.  She was detoxified with guanabenz.  She was placed on  Wellbutrin.  She was placed on Klonopin 0.5 three times a day.  She was  given Celexa 20 mg per day and Neurontin 200 mg three times a day.  Neurontin was eventually increased to 300 mg three times a day, and she was  given some Seroquel as needed.  She endorsed chronic pain issues with abuse  of narcotics.  She was in bed the first few days.  Later on she started  getting out, getting up, going to groups.  Endorsed that she was using the  opioids to get the energy that she needed to move on.  Endorsed that she was  very overwhelmed, recurrent panic attacks, went to the emergency room  several times a month with panic.  She was given some Klonopin that she  takes, she runs out of them and suffers before she is able to get another  prescription.  She claimed she did not abuse the Klonopin.  Endorsed she was  better on Celexa.  We pursued the Celexa and added the Neurontin.  Endorsed  anxiety, continued to claim  that Klonopin was the only medication that would  help the anxiety.  She was willing to give the Neurontin a trial but was  somewhat skeptical.  She was given Klonopin 0.5 mg three times a day; she  wanted it to be increased to 1 mg three times a day.  By November 09, 2005,  she was in full contact with reality.  There were no suicidal ideas, no  homicidal ideas, no hallucinations, no delusions.  She was willing and  motivated to pursue further outpatient treatment and had received some  relief with the Neurontin and was willing to give it a try.   DISCHARGE DIAGNOSES:  AXIS I:  Anxiety disorder, not otherwise specified.  Depressive disorder, not otherwise specified.  Polysubstance abuse.  AXIS II:  No diagnosis.  AXIS III:  Overweight.  AXIS IV:  Moderate.  AXIS V:  Upon discharge global assessment of function 50-55.   DISCHARGE MEDICATIONS:  1.  Wellbutrin XL 150 mg in the morning.  2.  Klonopin 0.5 three times a day as needed.  3.  Celexa 20 mg 1 daily.  4.  Neurontin 300 mg 1 three times a day.   FOLLOW UP:  [facility].      Geoffery Lyons, M.D.  Electronically Signed     IL/MEDQ  D:  11/15/2005  T:  11/16/2005  Job:  [id number]

## 2011-09-19 LAB — I-STAT 8, (EC8 V) (CONVERTED LAB)
BUN: 5 — ABNORMAL LOW
Chloride: 111
Glucose, Bld: 125 — ABNORMAL HIGH
Hemoglobin: 16 — ABNORMAL HIGH
Potassium: 3.5
Sodium: 141
TCO2: 12
pH, Ven: 7.297

## 2011-09-19 LAB — CBC
HCT: 26 — ABNORMAL LOW
HCT: 33.7 — ABNORMAL LOW
HCT: 42.2
HCT: 39.2
Hemoglobin: 14.3
Hemoglobin: 9.1 — ABNORMAL LOW
Hemoglobin: 13.3
MCHC: 33.8
MCHC: 35.1
MCV: 99.8
MCV: 98.4
Platelets: 138 — ABNORMAL LOW
Platelets: 186
Platelets: 190
Platelets: 347
Platelets: 328
RBC: 2.76 — ABNORMAL LOW
RBC: 3.41 — ABNORMAL LOW
RBC: 3.99
RDW: 14.6
RDW: 15.1
WBC: 10.3
WBC: 8.8
WBC: 8.2

## 2011-09-19 LAB — CALCIUM, IONIZED: Calcium, Ion: 1.2

## 2011-09-19 LAB — BASIC METABOLIC PANEL
BUN: 4 — ABNORMAL LOW
BUN: 5 — ABNORMAL LOW
BUN: <1 — ABNORMAL LOW
Calcium: 7.9 — ABNORMAL LOW
Calcium: 8.4
Creatinine, Ser: 0.7
GFR calc Af Amer: >60
GFR calc Af Amer: >60
GFR calc non Af Amer: >60
GFR calc non Af Amer: >60
GFR calc non Af Amer: >60
Glucose, Bld: 96
Potassium: 3.3 — ABNORMAL LOW
Potassium: 3.6
Sodium: 139

## 2011-09-19 LAB — POCT I-STAT CREATININE: Operator id: 294511

## 2013-09-03 ENCOUNTER — Encounter (HOSPITAL_COMMUNITY): Payer: Self-pay

## 2013-09-03 ENCOUNTER — Emergency Department (HOSPITAL_COMMUNITY)
Admission: EM | Admit: 2013-09-03 | Discharge: 2013-09-03 | Disposition: A | Discharge status: Home / Self Care | Payer: Medicaid - Out of State | Attending: Emergency Medicine | Admitting: Emergency Medicine

## 2013-09-03 DIAGNOSIS — M549 Dorsalgia, unspecified: Secondary | ICD-10-CM | POA: Insufficient documentation

## 2013-09-03 DIAGNOSIS — R197 Diarrhea, unspecified: Secondary | ICD-10-CM | POA: Insufficient documentation

## 2013-09-03 DIAGNOSIS — F17200 Nicotine dependence, unspecified, uncomplicated: Secondary | ICD-10-CM | POA: Insufficient documentation

## 2013-09-03 DIAGNOSIS — R110 Nausea: Secondary | ICD-10-CM | POA: Insufficient documentation

## 2013-09-03 DIAGNOSIS — Z8659 Personal history of other mental and behavioral disorders: Secondary | ICD-10-CM | POA: Insufficient documentation

## 2013-09-03 DIAGNOSIS — G8929 Other chronic pain: Secondary | ICD-10-CM | POA: Insufficient documentation

## 2013-09-03 DIAGNOSIS — Z8781 Personal history of (healed) traumatic fracture: Secondary | ICD-10-CM | POA: Insufficient documentation

## 2013-09-03 DIAGNOSIS — R109 Unspecified abdominal pain: Secondary | ICD-10-CM | POA: Insufficient documentation

## 2013-09-03 DIAGNOSIS — M79609 Pain in unspecified limb: Secondary | ICD-10-CM | POA: Insufficient documentation

## 2013-09-03 HISTORY — DX: Anxiety disorder, unspecified: F41.9

## 2013-09-03 HISTORY — DX: Multiple fractures of ribs, unspecified side, initial encounter for closed fracture: S22.49XA

## 2013-09-03 HISTORY — DX: Reserved for concepts with insufficient information to code with codable children: IMO0002

## 2013-09-03 HISTORY — DX: Fracture of mandible, unspecified, initial encounter for closed fracture: S02.609A

## 2013-09-03 HISTORY — DX: Pain in leg, unspecified: M79.606

## 2013-09-03 HISTORY — DX: Other chronic pain: G89.29

## 2013-09-03 HISTORY — DX: Fracture of one rib, unspecified side, initial encounter for closed fracture: S22.39XA

## 2013-09-03 HISTORY — DX: Depression, unspecified: F32.A

## 2013-09-03 HISTORY — DX: Major depressive disorder, single episode, unspecified: F32.9

## 2013-09-03 HISTORY — DX: Dorsalgia, unspecified: M54.9

## 2013-09-03 LAB — COMPREHENSIVE METABOLIC PANEL
Alkaline Phosphatase: 50 U/L (ref 39–117)
BUN: 13 mg/dL (ref 6–23)
Creatinine, Ser: 0.81 mg/dL (ref 0.50–1.10)
GFR calc Af Amer: >90 mL/min (ref >90)
Glucose, Bld: 98 mg/dL (ref 70–99)
Potassium: 4.3 mEq/L (ref 3.5–5.1)
Total Protein: 6 g/dL (ref 6.0–8.3)

## 2013-09-03 LAB — CBC
HCT: 39.2 % (ref 36.0–46.0)
Hemoglobin: 13.3 g/dL (ref 12.0–15.0)
MCH: 33 pg (ref 26.0–34.0)
MCHC: 33.9 g/dL (ref 30.0–36.0)
MCV: 97.3 fL (ref 78.0–100.0)

## 2013-09-03 LAB — LIPASE, BLOOD: Lipase: 63 U/L — ABNORMAL HIGH (ref 11–59)

## 2013-09-03 MED ORDER — OXYCODONE-ACETAMINOPHEN 5-325 MG PO TABS
1.0000 | ORAL_TABLET | Freq: Once | ORAL | Status: AC
  Administered 2013-09-03: 1 via ORAL
  Filled 2013-09-03: qty 1

## 2013-09-03 MED ORDER — LOPERAMIDE HCL 2 MG PO CAPS: 2.0000 mg | ORAL_CAPSULE | Freq: Four times a day (QID) | ORAL | Status: DC | PRN

## 2013-09-03 MED ORDER — ONDANSETRON HCL 4 MG/2ML IJ SOLN
4.0000 mg | Freq: Once | INTRAMUSCULAR | Status: AC
  Administered 2013-09-03: 4 mg via INTRAVENOUS
  Filled 2013-09-03: qty 2

## 2013-09-03 MED ORDER — CYCLOBENZAPRINE HCL 10 MG PO TABS
10.0000 mg | ORAL_TABLET | Freq: Once | ORAL | Status: AC
  Administered 2013-09-03: 10 mg via ORAL
  Filled 2013-09-03: qty 1

## 2013-09-03 MED ORDER — SODIUM CHLORIDE 0.9 % IV BOLUS (SEPSIS)
1000.0000 mL | Freq: Once | INTRAVENOUS | Status: AC
  Administered 2013-09-03: 1000 mL via INTRAVENOUS

## 2013-09-03 MED ORDER — KETOROLAC TROMETHAMINE 30 MG/ML IJ SOLN
30.0000 mg | Freq: Once | INTRAMUSCULAR | Status: AC
  Administered 2013-09-03: 30 mg via INTRAVENOUS
  Filled 2013-09-03: qty 1

## 2013-09-03 MED ORDER — ONDANSETRON 4 MG PO TBDP
4.0000 mg | ORAL_TABLET | Freq: Once | ORAL | Status: AC
  Administered 2013-09-03: 4 mg via ORAL
  Filled 2013-09-03: qty 1
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
## 2013-09-03 NOTE — ED Notes (Signed)
Pt refused the flexeril and states she is not here seeking meds but the only thing that helps her is percocet and lortab 10mg 

## 2013-09-04 LAB — OVA AND PARASITE EXAMINATION

## 2013-09-07 LAB — STOOL CULTURE

## 2014-03-31 ENCOUNTER — Encounter (HOSPITAL_COMMUNITY): Payer: Self-pay | Admitting: *Deleted

## 2014-03-31 ENCOUNTER — Emergency Department (HOSPITAL_COMMUNITY)
Admission: EM | Admit: 2014-03-31 | Discharge: 2014-03-31 | Disposition: A | Discharge status: Other Acute Inpatient Hospital | Payer: Medicaid - Out of State | Attending: Emergency Medicine | Admitting: Emergency Medicine

## 2014-03-31 ENCOUNTER — Inpatient Hospital Stay (HOSPITAL_COMMUNITY)
Admission: AD | Admit: 2014-03-31 | Discharge: 2014-04-04 | DRG: 885 | Disposition: A | Discharge status: Home / Self Care | Payer: Federal, State, Local not specified - Other | Source: Intra-hospital | Attending: Psychiatry | Admitting: Psychiatry

## 2014-03-31 ENCOUNTER — Encounter (HOSPITAL_COMMUNITY): Payer: Self-pay | Admitting: Emergency Medicine

## 2014-03-31 DIAGNOSIS — F410 Panic disorder [episodic paroxysmal anxiety] without agoraphobia: Secondary | ICD-10-CM | POA: Diagnosis present

## 2014-03-31 DIAGNOSIS — F411 Generalized anxiety disorder: Secondary | ICD-10-CM | POA: Insufficient documentation

## 2014-03-31 DIAGNOSIS — F4310 Post-traumatic stress disorder, unspecified: Secondary | ICD-10-CM | POA: Diagnosis present

## 2014-03-31 DIAGNOSIS — R45851 Suicidal ideations: Secondary | ICD-10-CM

## 2014-03-31 DIAGNOSIS — Z87820 Personal history of traumatic brain injury: Secondary | ICD-10-CM

## 2014-03-31 DIAGNOSIS — F329 Major depressive disorder, single episode, unspecified: Secondary | ICD-10-CM

## 2014-03-31 DIAGNOSIS — F32A Depression, unspecified: Secondary | ICD-10-CM

## 2014-03-31 DIAGNOSIS — M549 Dorsalgia, unspecified: Secondary | ICD-10-CM | POA: Insufficient documentation

## 2014-03-31 DIAGNOSIS — Z0289 Encounter for other administrative examinations: Secondary | ICD-10-CM | POA: Insufficient documentation

## 2014-03-31 DIAGNOSIS — R000 Tachycardia, unspecified: Secondary | ICD-10-CM | POA: Insufficient documentation

## 2014-03-31 DIAGNOSIS — G8929 Other chronic pain: Secondary | ICD-10-CM | POA: Diagnosis present

## 2014-03-31 DIAGNOSIS — Z79899 Other long term (current) drug therapy: Secondary | ICD-10-CM | POA: Insufficient documentation

## 2014-03-31 DIAGNOSIS — F3289 Other specified depressive episodes: Secondary | ICD-10-CM | POA: Insufficient documentation

## 2014-03-31 DIAGNOSIS — R350 Frequency of micturition: Secondary | ICD-10-CM | POA: Insufficient documentation

## 2014-03-31 DIAGNOSIS — G4710 Hypersomnia, unspecified: Secondary | ICD-10-CM | POA: Diagnosis present

## 2014-03-31 DIAGNOSIS — F332 Major depressive disorder, recurrent severe without psychotic features: Principal | ICD-10-CM | POA: Diagnosis present

## 2014-03-31 DIAGNOSIS — F17200 Nicotine dependence, unspecified, uncomplicated: Secondary | ICD-10-CM | POA: Insufficient documentation

## 2014-03-31 DIAGNOSIS — Z3202 Encounter for pregnancy test, result negative: Secondary | ICD-10-CM | POA: Insufficient documentation

## 2014-03-31 DIAGNOSIS — M79609 Pain in unspecified limb: Secondary | ICD-10-CM | POA: Insufficient documentation

## 2014-03-31 DIAGNOSIS — G40909 Epilepsy, unspecified, not intractable, without status epilepticus: Secondary | ICD-10-CM | POA: Insufficient documentation

## 2014-03-31 HISTORY — DX: Unspecified convulsions: R56.9

## 2014-03-31 LAB — COMPREHENSIVE METABOLIC PANEL
ALBUMIN: 4.3 g/dL (ref 3.5–5.2)
ALK PHOS: 70 U/L (ref 39–117)
ALT: 13 U/L (ref 0–35)
AST: 18 U/L (ref 0–37)
BUN: 8 mg/dL (ref 6–23)
CALCIUM: 10.1 mg/dL (ref 8.4–10.5)
CO2: 25 mEq/L (ref 19–32)
CREATININE: 0.71 mg/dL (ref 0.50–1.10)
Chloride: 102 mEq/L (ref 96–112)
GFR calc non Af Amer: >90 mL/min (ref >90)
GLUCOSE: 100 mg/dL — AB (ref 70–99)
POTASSIUM: 4.1 meq/L (ref 3.7–5.3)
Sodium: 141 mEq/L (ref 137–147)
TOTAL PROTEIN: 8.3 g/dL (ref 6.0–8.3)
Total Bilirubin: 0.3 mg/dL (ref 0.3–1.2)

## 2014-03-31 LAB — CBC
HEMATOCRIT: 47.8 % — AB (ref 36.0–46.0)
HEMOGLOBIN: 15.9 g/dL — AB (ref 12.0–15.0)
MCH: 31.5 pg (ref 26.0–34.0)
MCHC: 33.3 g/dL (ref 30.0–36.0)
MCV: 94.7 fL (ref 78.0–100.0)
Platelets: 247 10*3/uL (ref 150–400)
RBC: 5.05 MIL/uL (ref 3.87–5.11)
RDW: 13.9 % (ref 11.5–15.5)
WBC: 10 10*3/uL (ref 4.0–10.5)

## 2014-03-31 LAB — ACETAMINOPHEN LEVEL

## 2014-03-31 LAB — RAPID URINE DRUG SCREEN, HOSP PERFORMED
Amphetamines: POSITIVE — AB
BARBITURATES: NOT DETECTED
BENZODIAZEPINES: POSITIVE — AB
COCAINE: NOT DETECTED
OPIATES: POSITIVE — AB
Tetrahydrocannabinol: NOT DETECTED

## 2014-03-31 LAB — URINE MICROSCOPIC-ADD ON

## 2014-03-31 LAB — URINALYSIS, ROUTINE W REFLEX MICROSCOPIC
Bilirubin Urine: NEGATIVE
Glucose, UA: NEGATIVE mg/dL
Hgb urine dipstick: NEGATIVE
KETONES UR: NEGATIVE mg/dL
Nitrite: NEGATIVE
PROTEIN: NEGATIVE mg/dL
Specific Gravity, Urine: 1.027 (ref 1.005–1.030)
Urobilinogen, UA: 1 mg/dL (ref 0.0–1.0)
pH: 7 (ref 5.0–8.0)

## 2014-03-31 LAB — PREGNANCY, URINE: PREG TEST UR: NEGATIVE

## 2014-03-31 LAB — SALICYLATE LEVEL

## 2014-03-31 LAB — ETHANOL

## 2014-03-31 MED ORDER — ALUM & MAG HYDROXIDE-SIMETH 200-200-20 MG/5ML PO SUSP: 30.0000 mL | ORAL | Status: DC | PRN

## 2014-03-31 MED ORDER — LORAZEPAM 1 MG PO TABS: 1.0000 mg | ORAL_TABLET | Freq: Three times a day (TID) | ORAL | Status: DC | PRN

## 2014-03-31 MED ORDER — ZOLPIDEM TARTRATE 5 MG PO TABS: 5.0000 mg | ORAL_TABLET | Freq: Every evening | ORAL | Status: DC | PRN

## 2014-03-31 MED ORDER — ONDANSETRON HCL 4 MG PO TABS: 4.0000 mg | ORAL_TABLET | Freq: Three times a day (TID) | ORAL | Status: DC | PRN

## 2014-03-31 MED ORDER — NICOTINE 21 MG/24HR TD PT24
21.0000 mg | MEDICATED_PATCH | Freq: Every day | TRANSDERMAL | Status: DC
  Administered 2014-03-31: 21 mg via TRANSDERMAL
  Filled 2014-03-31: qty 1

## 2014-03-31 MED ORDER — GABAPENTIN 400 MG PO CAPS
800.0000 mg | ORAL_CAPSULE | Freq: Three times a day (TID) | ORAL | Status: DC
  Administered 2014-03-31 (×2): 800 mg via ORAL
  Filled 2014-03-31: qty 2

## 2014-03-31 MED ORDER — CLONAZEPAM 1 MG PO TABS
2.0000 mg | ORAL_TABLET | Freq: Three times a day (TID) | ORAL | Status: DC
  Administered 2014-03-31 (×3): 2 mg via ORAL
  Filled 2014-03-31 (×3): qty 2

## 2014-03-31 MED ORDER — LEVOTHYROXINE SODIUM 25 MCG PO TABS
25.0000 ug | ORAL_TABLET | Freq: Every day | ORAL | Status: DC
  Administered 2014-03-31: 25 ug via ORAL
  Filled 2014-03-31 (×2): qty 1

## 2014-03-31 MED ORDER — ACETAMINOPHEN 325 MG PO TABS: 650.0000 mg | ORAL_TABLET | ORAL | Status: DC | PRN

## 2014-03-31 MED ORDER — OXYCODONE HCL 5 MG PO TABS
10.0000 mg | ORAL_TABLET | Freq: Three times a day (TID) | ORAL | Status: DC | PRN
  Administered 2014-03-31 (×3): 10 mg via ORAL
  Filled 2014-03-31 (×3): qty 2

## 2014-03-31 MED ORDER — IBUPROFEN 200 MG PO TABS: 600.0000 mg | ORAL_TABLET | Freq: Three times a day (TID) | ORAL | Status: DC | PRN

## 2014-03-31 MED ORDER — FLUOXETINE HCL 20 MG PO CAPS
40.0000 mg | ORAL_CAPSULE | Freq: Every day | ORAL | Status: DC
  Administered 2014-03-31: 40 mg via ORAL
  Filled 2014-03-31 (×2): qty 2

## 2014-03-31 MED ORDER — LORAZEPAM 1 MG PO TABS
1.0000 mg | ORAL_TABLET | Freq: Once | ORAL | Status: AC
  Administered 2014-03-31: 1 mg via ORAL
  Filled 2014-03-31: qty 1

## 2014-03-31 MED ORDER — GABAPENTIN 400 MG PO CAPS
400.0000 mg | ORAL_CAPSULE | Freq: Three times a day (TID) | ORAL | Status: DC
  Filled 2014-03-31 (×2): qty 1

## 2014-03-31 NOTE — ED Notes (Addendum)
Pt BIB EMS, reports increased SI for the past 3 days as she left her medication in [place]. Pt has a plan to slit her wrists. Increased feelings of worthlessness and hopelessness. Pt a&o x4, cooperative at this time. Denies AVH or HI.
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
## 2014-03-31 NOTE — ED Notes (Signed)
Patient resting quietly with eyes closed. Respirations even and unlabored. No distress noted . Q 15 minute check continues as ordered to maintain safety. 

## 2014-03-31 NOTE — ED Notes (Signed)
Patient continues resting quietly with eyes closed. Respirations even and unlabored. No distress noted. Q 15 minute check continues as ordered to maintain safety.

## 2014-03-31 NOTE — ED Notes (Signed)
Report called to RN Jane, [facility].  Pending [facility] transport. 
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
## 2014-03-31 NOTE — ED Notes (Signed)
Charge spoke with [facility], pt needs TTS. 

## 2014-03-31 NOTE — Progress Notes (Signed)
[facility] provided pt with a [employer] Orange Card application, highlighting [facility] to help patient establish primary care.  °

## 2014-03-31 NOTE — ED Notes (Addendum)
Clinical research associateWriter spoke with representative at [facility] [place] [address] [phone]. Representative acknowledged pt has a current prescription for Neurontin 800mg  tid that she last filled on 03/05/14. Discussed with NP who gave verbal order to change her dose to 800mg  tid

## 2014-04-01 DIAGNOSIS — R45851 Suicidal ideations: Secondary | ICD-10-CM

## 2014-04-01 DIAGNOSIS — F4310 Post-traumatic stress disorder, unspecified: Secondary | ICD-10-CM | POA: Diagnosis present

## 2014-04-01 DIAGNOSIS — F332 Major depressive disorder, recurrent severe without psychotic features: Secondary | ICD-10-CM | POA: Diagnosis present

## 2014-04-01 MED ORDER — NICOTINE 21 MG/24HR TD PT24
21.0000 mg | MEDICATED_PATCH | Freq: Every day | TRANSDERMAL | Status: DC
  Administered 2014-04-01 – 2014-04-03 (×3): 21 mg via TRANSDERMAL
  Filled 2014-04-01 (×7): qty 1

## 2014-04-01 MED ORDER — ALUM & MAG HYDROXIDE-SIMETH 200-200-20 MG/5ML PO SUSP: 30.0000 mL | ORAL | Status: DC | PRN

## 2014-04-01 MED ORDER — MAGNESIUM HYDROXIDE 400 MG/5ML PO SUSP: 30.0000 mL | Freq: Every day | ORAL | Status: DC | PRN

## 2014-04-01 MED ORDER — ACETAMINOPHEN 325 MG PO TABS
650.0000 mg | ORAL_TABLET | Freq: Four times a day (QID) | ORAL | Status: DC | PRN
  Administered 2014-04-01 – 2014-04-03 (×4): 650 mg via ORAL
  Filled 2014-04-01 (×4): qty 2

## 2014-04-01 MED ORDER — LEVOTHYROXINE SODIUM 25 MCG PO TABS
25.0000 ug | ORAL_TABLET | Freq: Every day | ORAL | Status: DC
  Administered 2014-04-01 – 2014-04-04 (×4): 25 ug via ORAL
  Filled 2014-04-01 (×7): qty 1

## 2014-04-01 MED ORDER — CLONAZEPAM 1 MG PO TABS
ORAL_TABLET | ORAL | Status: AC
  Filled 2014-04-01: qty 1

## 2014-04-01 MED ORDER — CLONAZEPAM 1 MG PO TABS
1.0000 mg | ORAL_TABLET | Freq: Three times a day (TID) | ORAL | Status: DC
  Administered 2014-04-01 – 2014-04-03 (×9): 1 mg via ORAL
  Filled 2014-04-01 (×2): qty 1
  Filled 2014-04-01: qty 2
  Filled 2014-04-01: qty 1
  Filled 2014-04-01: qty 2
  Filled 2014-04-01 (×2): qty 1
  Filled 2014-04-01: qty 2

## 2014-04-01 MED ORDER — HYDROXYZINE HCL 50 MG PO TABS
50.0000 mg | ORAL_TABLET | Freq: Four times a day (QID) | ORAL | Status: DC | PRN
  Administered 2014-04-01 – 2014-04-03 (×4): 50 mg via ORAL
  Filled 2014-04-01 (×4): qty 1

## 2014-04-01 MED ORDER — FLUOXETINE HCL 20 MG PO CAPS
40.0000 mg | ORAL_CAPSULE | Freq: Every day | ORAL | Status: DC
Start: 2014-04-01 — End: 2014-04-04
  Administered 2014-04-01 – 2014-04-04 (×4): 40 mg via ORAL
  Filled 2014-04-01 (×7): qty 2

## 2014-04-01 MED ORDER — GABAPENTIN 400 MG PO CAPS
800.0000 mg | ORAL_CAPSULE | Freq: Three times a day (TID) | ORAL | Status: DC
  Administered 2014-04-01 – 2014-04-04 (×11): 800 mg via ORAL
  Filled 2014-04-01 (×17): qty 2

## 2014-04-01 MED ORDER — OXYCODONE HCL 5 MG PO TABS
10.0000 mg | ORAL_TABLET | Freq: Three times a day (TID) | ORAL | Status: DC | PRN
  Administered 2014-04-01 – 2014-04-04 (×10): 10 mg via ORAL
  Filled 2014-04-01 (×10): qty 2

## 2014-04-01 NOTE — [facility] Suicide Risk Assessment (Signed)
[facility] Family/Significant Other Suicide Prevention Education  Suicide Prevention Education:   Patient Refusal for Family/Significant Other Suicide Prevention Education: The patient has refused to provide written consent for family/significant other to be provided Family/Significant Other Suicide Prevention Education during admission and/or prior to discharge.  Physician notified.  CSW provided suicide prevention information with patient.    The suicide prevention education provided includes the following:  Suicide risk factors  Suicide prevention and interventions  National Suicide Hotline telephone number  [facility] assessment telephone number  [facility] Emergency Assistance 911  [facility] and/or [facility] Crisis Unit telephone number   Reyes IvanChelsea Horton, [place]CS[place] 04/01/2014 9:35 AM

## 2014-04-01 NOTE — Progress Notes (Signed)
Pt reports she is still in a lot of pain and is at the med window frequently asking for pain medication.  She denies SI/HI/AV at this time.  Previous shift RN reported that pt had been in bed all day and had not attended any groups.  Pt came to the window as group was starting to ask if she could get medication.  Pt received her oxycodone and was encouraged to go to the evening group which she did.  Pt was restarted on her home medications today.  Pt is encouraged to participate in unit activities and groups.  Pt makes her needs known to staff.  Support and encouragement offered.  Safety maintained with q15 minute checks.

## 2014-04-01 NOTE — Progress Notes (Signed)
Recreation Therapy Notes  Animal-Assisted Activity/Therapy (AAA/T) Program Checklist/Progress Notes Patient Eligibility Criteria Checklist & Daily Group note for Rec Tx Intervention  Date: 04.07.2015 Time: 2:45pm [facility]: [address] [facility]   AAA/T Program Assumption of Risk Form signed by Patient/ or Parent Legal Guardian yes  Patient is free of allergies or sever asthma yes  Patient reports no fear of animals yes  Patient reports no history of cruelty to animals yes   Patient understands his/her participation is voluntary yes  Behavioral Response: Did not attend.   Marykay Lexenise L Kedar Sedano, LRT/CTRS  Jearl KlinefelterBlanchfield, Teela Narducci L 04/01/2014 4:52 PM

## 2014-04-01 NOTE — [facility] Group Notes (Signed)
[facility] LCSW Group Therapy  04/01/2014 1:15 PM   Type of Therapy:  Group Therapy  Participation Level:  Did Not Attend - pt stated that she didn't feel well and didn't want to come to group  Susan Ivanhelsea Horton, LCSW 04/01/2014 2:41 PM
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
## 2014-04-01 NOTE — Progress Notes (Signed)
Patient ID: [name], female   DOB: 05/08/1971, 43 y.o.   MRN: [id number] D: pt. In bed, eyes closed, respirations even. A: Writer observed for s/s of distress. Staff will monitor q4415min for safety. R: Pt. Is safe on the unit, no distress noted, respirations unlabored.
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
## 2014-04-01 NOTE — Progress Notes (Signed)
The focus of this group is to educate the patient on the purpose and policies of crisis stabilization and provide a format to answer questions about their admission.  The group details unit policies and expectations of patients while admitted. Patient did not attend this group because she was sleeping.

## 2014-04-01 NOTE — ED Provider Notes (Signed)
Medical screening examination/treatment/procedure(s) were performed by non-physician practitioner and as supervising physician I was immediately available for consultation/collaboration.   EKG Interpretation None       Derwood KaplanAnkit Adrea Sherpa, MD 04/01/14 [id number]

## 2014-04-01 NOTE — Progress Notes (Signed)
D: Patient denies SI/HI and A/V hallucinations; patient has had complaints of pain  A: Monitored q 15 minutes; patient encouraged to attend groups; patient educated about medications; patient given medications per physician orders; patient encouraged to express feelings and/or concerns  R: Patient is asking for medications before they are due again and reporting that its helping but when hour reassessment is done the patient is asleep; patient is flat and sad; patient is isolative; patient's interaction with staff and peers is minimal and she forwards little information; patient was able to set goal to talk with staff 1:1 when having feelings of SI; patient is taking medications as prescribed and tolerating medications; patient is not attending any groups

## 2014-04-01 NOTE — Progress Notes (Signed)
Adult Psychoeducational Group Note  Date:  04/01/2014 Time:  9:24 PM  Group Topic/Focus:  Wrap-Up Group:   The focus of this group is to help patients review their daily goal of treatment and discuss progress on daily workbooks.  Participation Level:  Active  Participation Quality:  Appropriate  Affect:  Appropriate  Cognitive:  Appropriate  Insight: Appropriate  Engagement in Group:  Engaged  Modes of Intervention:  Discussion  Additional Comments:  Pt attended wrap-up group this evening and participate.  Pt stated she had a good day. Pt was able to speak with the doctor today. Pt stated " I was able to sleep more today."   Rawad Bochicchio A 04/01/2014, 9:24 PM
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
## 2014-04-02 NOTE — Progress Notes (Signed)
Adult Psychoeducational Group Note  Date:  04/02/2014 Time:  10:39 AM  Group Topic/Focus:  Stages of Change:   The focus of this group is to explain the stages of change and help patients identify changes they want to make upon discharge.  Participation Level:  Minimal  Participation Quality:  Appropriate and Attentive  Affect:  Flat  Cognitive:  Alert and Appropriate  Insight: None  Engagement in Group:  Poor  Modes of Intervention:  Discussion, Education, Socialization and Support  Additional Comments:  Pt joined group towards the end and did not share.    Berlin HunDavid M Sherre Wooton 04/02/2014, 10:39 AM

## 2014-04-02 NOTE — Progress Notes (Signed)
[facility] MD Progress Note  04/02/2014 4:32 PM [name]  MRN:  [id number] Subjective:  Met with patient today to discuss her progress and response to treatment. She is focused on her pain medication stating that she left it all at home in [place]. She endorses depression. When asked about the UDS +for amphetamines she states that she was taking Sudafed for a few days prior to her ED visit. This has happened before, but she is adamant that she does not take amphetamines. She states she hasn't been to all the groups yet because she doesn't feel well.  Diagnosis:   DSM5: Schizophrenia Disorders:   Obsessive-Compulsive Disorders:   Trauma-Stressor Disorders:   Substance/Addictive Disorders:   Depressive Disorders:  Major Depressive Disorder - Moderate (296.22) Total Time spent with patient: 15 minutes  ADL's:  Intact  Sleep: Good  Appetite:  Good  Suicidal Ideation:  denies Homicidal Ideation:  denies AEB (as evidenced by):  Psychiatric Specialty Exam: Physical Exam  Review of Systems  All other systems reviewed and are negative.   Blood pressure 137/86, pulse 104, temperature 97.8 F (36.6 C), temperature source Oral, resp. rate 18, height _0  (1.676 m), weight 92.534 kg (204 lb), last menstrual period 03/01/2014, SpO2 98.00%.Body mass index is 32.94 kg/(m^2).  General Appearance: Disheveled  Eye Contact::  Poor  Speech:  Clear and Coherent and Slow  Volume:  Decreased  Mood:  Depressed  Affect:  Flat  Thought Process:  Goal Directed  Orientation:  Full (Time, Place, and Person)  Thought Content:  WDL  Suicidal Thoughts:  No  Homicidal Thoughts:  No  Memory:  NA  Judgement:  Fair  Insight:  Shallow  Psychomotor Activity:  Decreased  Concentration:  Poor  Recall:  [place] of Knowledge:Fair  Language: Fair  Akathisia:  No  Handed:  Right  AIMS (if indicated):     Assets:  Desire for Improvement  Sleep:  Number of Hours: 2.75   Musculoskeletal: Strength & Muscle  Tone: within normal limits Gait & Station: normal Patient leans: N/A  Current Medications: Current Facility-Administered Medications  Medication Dose Route Frequency Provider Last Rate Last Dose  . acetaminophen (TYLENOL) tablet 650 mg  650 mg Oral Q6H PRN Encarnacion Slates, NP   650 mg at 04/01/14 2131  . alum & mag hydroxide-simeth (MAALOX/MYLANTA) 200-200-20 MG/5ML suspension 30 mL  30 mL Oral Q4H PRN Encarnacion Slates, NP      . clonazePAM (KLONOPIN) tablet 1 mg  1 mg Oral TID Durward Parcel, MD   1 mg at 04/02/14 1156  . FLUoxetine (PROZAC) capsule 40 mg  40 mg Oral Daily Durward Parcel, MD   40 mg at 04/02/14 0806  . gabapentin (NEURONTIN) capsule 800 mg  800 mg Oral TID Encarnacion Slates, NP   800 mg at 04/02/14 1156  . hydrOXYzine (ATARAX/VISTARIL) tablet 50 mg  50 mg Oral Q6H PRN Encarnacion Slates, NP   50 mg at 04/01/14 1434  . levothyroxine (SYNTHROID, LEVOTHROID) tablet 25 mcg  25 mcg Oral QAC breakfast Encarnacion Slates, NP   25 mcg at 04/02/14 8315  . magnesium hydroxide (MILK OF MAGNESIA) suspension 30 mL  30 mL Oral Daily PRN Encarnacion Slates, NP      . nicotine (NICODERM CQ - dosed in mg/24 hours) patch 21 mg  21 mg Transdermal Q0600 Encarnacion Slates, NP   21 mg at 04/02/14 1761  . oxyCODONE (Oxy IR/ROXICODONE) immediate release tablet  10 mg  10 mg Oral TID PRN Durward Parcel, MD   10 mg at 04/02/14 1112    Lab Results: No results found for this or any previous visit (from the past 48 hour(s)).  Physical Findings: AIMS: Facial and Oral Movements Muscles of Facial Expression: None, normal Lips and Perioral Area: None, normal Jaw: None, normal Tongue: None, normal,Extremity Movements Upper (arms, wrists, hands, fingers): None, normal Lower (legs, knees, ankles, toes): None, normal, Trunk Movements Neck, shoulders, hips: None, normal, Overall Severity Severity of abnormal movements (highest score from questions above): None, normal Incapacitation due to abnormal  movements: None, normal Patient's awareness of abnormal movements (rate only patient's report): No Awareness, Dental Status Current problems with teeth and/or dentures?: No Does patient usually wear dentures?: Yes (upper)  CIWA:    COWS:     Treatment Plan Summary: Daily contact with patient to assess and evaluate symptoms and progress in treatment Medication management  Plan: 1. Every effort is being made to contact this patient's primary care MD. Dr. Monte Fantasia in [place]. 2. Continue current plan of care as written with no changes at this time. 3. Patient is encouraged to participate in all unit programming.  Medical Decision Making Problem Points:  Established problem, stable/improving (1) Data Points:  Review and summation of old records (2)  I certify that inpatient services furnished can reasonably be expected to improve the patient's condition.   Marlane Hatcher. Mashburn RPAC 4:38 PM 04/02/2014  Reviewed the information documented and agree with the treatment plan.  Parke Simmers Maylen Waltermire 04/03/2014 12:11 PM

## 2014-04-02 NOTE — Progress Notes (Signed)
Adult Psychoeducational Group Note  Date:  04/02/2014 Time:  8:30 PM  Group Topic/Focus:  Wrap-Up Group:   The focus of this group is to help patients review their daily goal of treatment and discuss progress on daily workbooks.  Participation Level:  Active  Participation Quality:  Appropriate  Affect:  Appropriate  Cognitive:  Appropriate  Insight: Appropriate  Engagement in Group:  Engaged  Modes of Intervention:  Support  Additional Comments:  Pt stated that she got to tlk with her mother and that was the positive part of her day. She stated that her goal for tomorrow was to try to attend more groups.   Taras Rask 04/02/2014, 8:30 PM

## 2014-04-02 NOTE — Progress Notes (Signed)
Pt reports she is feeling the same as yesterday.  She is receiving her pain medications as ordered, but is asking for it usually before it is time for dosing.  She has spent most of the day in bed.  Previous shift RN reported that pt was told she needed to get up and participate in groups or she would need to be discharged.  Pt just got up a few minutes ago asking for her pain med.  When asked if she was going to attend group, pt affirmed her intent to go, and Clinical research associatewriter did observe her enter the dayroom.  Pt denies SI/HI/AV.  Her main focus is her chronic pain.  Pt plans to return home at discharge.  Support and encouragement offered.  Safety maintained with q15 minute checks.

## 2014-04-02 NOTE — Tx Team (Signed)
Interdisciplinary Treatment Plan Update (Adult)  Date: 04/02/2014  Time Reviewed:  9:45 AM  Progress in Treatment: Attending groups: Yes Participating in groups:  Yes Taking medication as prescribed:  Yes Tolerating medication:  Yes Family/Significant othe contact made: CSW assessing  Patient understands diagnosis:  Yes Discussing patient identified problems/goals with staff:  Yes Medical problems stabilized or resolved:  Yes Denies suicidal/homicidal ideation: Yes Issues/concerns per patient self-inventory:  Yes Other:  New problem(s) identified: N/A  Discharge Plan or Barriers: CSW assessing for appropriate referrals.  Reason for Continuation of Hospitalization: Anxiety Depression Medication Stabilization  Comments: N/A  Estimated length of stay: 3-5 days  For review of initial/current patient goals, please see plan of care.  Attendees: Patient:     Family:     Physician:  Dr. Johnalagadda 04/02/2014 9:49 AM   Nursing:   Christa Dopson, RN 04/02/2014 9:49 AM   Clinical Social Worker:  Jabbar Palmero Horton, LCSW 04/02/2014 9:49 AM   Other: Neil Mashburn, PA 04/02/2014 9:49 AM   Other:  Valerie Noch, care coordination 04/02/2014 9:49 AM   Other:  Quylle Hodnett, LCSW 04/02/2014 9:49 AM   Other:  Ronicia Byrd, RN 04/02/2014 9:49 AM   Other:    Other:    Other:    Other:    Other:    Other:     Scribe for Treatment Team:   Horton, Zayden Hahne Nicole, 04/02/2014 9:49 AM   

## 2014-04-02 NOTE — Progress Notes (Signed)
Patient ID: [name], female   DOB: 12/10/1971, 43 y.o.   MRN: [id number]  D: Pt. Denies SI/HI and A/V Hallucinations. Patient does report pain in her left leg and lower back. Patient received scheduled Neurontin to help with this. Patient reported that her pain goes down to 5/10 with this medication from 7/10. Patient did not turn in her daily inventory sheet today.  A: Support and encouragement provided to the patient. Writer informed patient that per her treatment agreement patient needs to attend groups in order to receive full benefit from this inpatient treatment. Patient verbalized understanding. Scheduled medications given to patient per physician's orders.  R: Patient is receptive and cooperative but med seeking. Q15 minute checks are maintained for safety.

## 2014-04-02 NOTE — [facility] Group Notes (Signed)
[facility] LCSW Group Therapy  04/02/2014 1:15 PM   Type of Therapy:  Group Therapy  Participation Level:  Did Not Attend - pt refused to come to group and didn't have a valid reason why she didn't want to.  CSW discussed that if pt doesn't participate in treatment we will have to look at d/c.  Pt states that she will try to come to the next group.    Susan IvanChelsea Horton, LCSW 04/02/2014 2:53 PM

## 2014-04-02 NOTE — [facility] Group Notes (Signed)
[facility]   04/02/2014  8:45 AM  Participation Quality:  Did Not Attend - pt refusing to come to group and is aware group is going on  Susan IvanChelsea Horton, LCSW 04/02/2014 9:38 AM

## 2014-04-03 DIAGNOSIS — F321 Major depressive disorder, single episode, moderate: Secondary | ICD-10-CM

## 2014-04-03 MED ORDER — CLONAZEPAM 0.5 MG PO TABS
0.5000 mg | ORAL_TABLET | Freq: Three times a day (TID) | ORAL | Status: DC
  Administered 2014-04-04 (×2): 0.5 mg via ORAL
  Filled 2014-04-03 (×2): qty 1

## 2014-04-03 NOTE — Progress Notes (Signed)
Psychoeducational Group Note  Date:  04/03/2014 Time:  8:00 p.m.   Group Topic/Focus:  Wrap-Up Group:   The focus of this group is to help patients review their daily goal of treatment and discuss progress on daily workbooks.  Participation Level: Did Not Attend  Participation Quality:  Not Applicable  Affect:  Not Applicable  Cognitive:  Not Applicable  Insight:  Not Applicable  Engagement in Group: Not Applicable  Additional Comments:  The patient did not attend group since she was asleep in her bed.   Westly PamBenjamin S Liem Copenhaver 04/03/2014, 10:12 PM

## 2014-04-03 NOTE — [facility] Group Notes (Signed)
[facility] LCSW Group Therapy  04/03/2014 1:15 PM   Type of Therapy:  Group Therapy  Participation Level:  Did Not Attend - pt states "I'm sick on the stomach"  Susan IvanChelsea Horton, LCSW 04/03/2014 3:15 PM

## 2014-04-03 NOTE — Progress Notes (Signed)
Patient ID: [name], female   DOB: 12/01/1971, 43 y.o.   MRN: [id number]  D: Pt. Denies SI/HI and A/V Hallucinations. Patient did not fill out daily inventory sheet although Clinical research associatewriter requested. Patient does report pain but refused intervention.   A: Support and encouragement provided to the patient. Scheduled medications given to patient per physician's orders.  R: Patient is minimal and is still not attending groups. Patient reports her pain is debilitating her from going to group however patient is sleeping in bed. Q15 minute checks are maintained for safety.

## 2014-04-03 NOTE — Progress Notes (Signed)
Recreation Therapy Notes  Animal-Assisted Activity/Therapy (AAA/T) Program Checklist/Progress Notes Patient Eligibility Criteria Checklist & Daily Group note for Rec Tx Intervention  Date: 04.09.2015 Time: 2:45pm Location: [address] Hall Dayroom    AAA/T Program Assumption of Risk Form signed by Patient/ or Parent Legal Guardian yes  Patient is free of allergies or sever asthma yes  Patient reports no fear of animals yes  Patient reports no history of cruelty to animals yes   Patient understands his/her participation is voluntary yes  Behavioral Response: DID NOT ATTEND.  [name] [name], LRT/CTRS  [name] [name] 04/03/2014 3:56 PM 

## 2014-04-03 NOTE — Progress Notes (Signed)
Patient ID: [name], female   DOB: 09/16/1971, 43 y.o.   MRN: [id number]  [facility] 0900 AM  The focus of this group is to educate the patient on the purpose and policies of crisis stabilization and provide a format to answer questions about their admission.  The group details unit policies and expectations of patients while admitted.  Patient did not attend group.

## 2014-04-03 NOTE — Progress Notes (Signed)
Patient ID: [name], female   DOB: 08-31-1971, 43 y.o.   MRN: [id number] [facility] MD Progress Note  04/03/2014 1:36 PM [name]  MRN:  [id number] Subjective:  Patient has no complaints today and reportedly feeling somewhat better and reportedly her symptoms of depression reduced from 8/10 to  6/10 and anxiety he just from 7/10-5/10. Patient has been compliant with her medications and has no reported adverse effects. Patient stated sleeping during the daytime because of not sleeping at nighttime. Patient denies suicidal ideation but contracts for safety while in the hospital. Patient was seen staying in her bed during the daytime and calm and cooperative. Received information from her primary care physician who is concerned about her misuse of drugs and drug seeking behavior. Patient was not able to stay with mental health because of not getting along with her psychiatrist. She is focused on her pain medication stating that she [place] it all at [place]. She states she hasn't been to all the groups yet because she doesn't feel well.  Diagnosis:   DSM5: Schizophrenia Disorders:   Obsessive-Compulsive Disorders:   Trauma-Stressor Disorders:   Substance/Addictive Disorders:   Depressive Disorders:  Major Depressive Disorder - Moderate (296.22) Total Time spent with patient: 15 minutes  ADL's:  Intact  Sleep: Good  Appetite:  Good  Suicidal Ideation:  denies Homicidal Ideation:  denies AEB (as evidenced by):  Psychiatric Specialty Exam: Physical Exam  Review of Systems  All other systems reviewed and are negative.   Blood pressure 137/86, pulse 104, temperature 97.8 F (36.6 C), temperature source Oral, resp. rate 18, height 5\' 6"  (1.676 m), weight 92.534 kg (204 lb), last menstrual period 03/01/2014, SpO2 98.00%.Body mass index is 32.94 kg/(m^2).  General Appearance: Disheveled  Eye Contact::  Poor  Speech:  Clear and Coherent and Slow  Volume:  Decreased  Mood:  Depressed   Affect:  Flat  Thought Process:  Goal Directed  Orientation:  Full (Time, Place, and Person)  Thought Content:  WDL  Suicidal Thoughts:  No  Homicidal Thoughts:  No  Memory:  NA  Judgement:  Fair  Insight:  Shallow  Psychomotor Activity:  Decreased  Concentration:  Poor  Recall:  Fair  Fund of Knowledge:Fair  Language: Fair  Akathisia:  No  Handed:  Right  AIMS (if indicated):     Assets:  Desire for Improvement  Sleep:  Number of Hours: 4   Musculoskeletal: Strength & Muscle Tone: within normal limits Gait & Station: normal Patient leans: N/A  Current Medications: Current Facility-Administered Medications  Medication Dose Route Frequency Provider Last Rate Last Dose  . acetaminophen (TYLENOL) tablet 650 mg  650 mg Oral Q6H PRN Sanjuana Kava, NP   650 mg at 04/03/14 0027  . alum & mag hydroxide-simeth (MAALOX/MYLANTA) 200-200-20 MG/5ML suspension 30 mL  30 mL Oral Q4H PRN Sanjuana Kava, NP      . clonazePAM (KLONOPIN) tablet 1 mg  1 mg Oral TID Nehemiah Settle, MD   1 mg at 04/03/14 1201  . FLUoxetine (PROZAC) capsule 40 mg  40 mg Oral Daily Nehemiah Settle, MD   40 mg at 04/03/14 0840  . gabapentin (NEURONTIN) capsule 800 mg  800 mg Oral TID Sanjuana Kava, NP   800 mg at 04/03/14 1201  . hydrOXYzine (ATARAX/VISTARIL) tablet 50 mg  50 mg Oral Q6H PRN Sanjuana Kava, NP   50 mg at 04/02/14 2135  . levothyroxine (SYNTHROID, LEVOTHROID) tablet 25  mcg  25 mcg Oral QAC breakfast Sanjuana KavaAgnes I Nwoko, NP   25 mcg at 04/03/14 [id number]  . magnesium hydroxide (MILK OF MAGNESIA) suspension 30 mL  30 mL Oral Daily PRN Sanjuana KavaAgnes I Nwoko, NP      . nicotine (NICODERM CQ - dosed in mg/24 hours) patch 21 mg  21 mg Transdermal Q0600 Sanjuana KavaAgnes I Nwoko, NP   21 mg at 04/03/14 [id number]  . oxyCODONE (Oxy IR/ROXICODONE) immediate release tablet 10 mg  10 mg Oral TID PRN Nehemiah SettleJanardhaha R Jibri Schriefer, MD   10 mg at 04/03/14 1201    Lab Results: No results found for this or any previous visit (from the  past 48 hour(s)).  Physical Findings: AIMS: Facial and Oral Movements Muscles of Facial Expression: None, normal Lips and Perioral Area: None, normal Jaw: None, normal Tongue: None, normal,Extremity Movements Upper (arms, wrists, hands, fingers): None, normal Lower (legs, knees, ankles, toes): None, normal, Trunk Movements Neck, shoulders, hips: None, normal, Overall Severity Severity of abnormal movements (highest score from questions above): None, normal Incapacitation due to abnormal movements: None, normal Patient's awareness of abnormal movements (rate only patient's report): No Awareness, Dental Status Current problems with teeth and/or dentures?: No Does patient usually wear dentures?: Yes (upper)  CIWA:    COWS:     Treatment Plan Summary: Daily contact with patient to assess and evaluate symptoms and progress in treatment Medication management  Plan: 1. Contact this patient's primary care MD. Dr. Pricilla RifflePeter Gibbs in [place] 2. Continue current plan of care as written with no changes at this time. Continue fluoxetine 40 mg daily Neurontin 800 mg 3 times daily and Synthroid 25 mg after breakfast and decrease Klonopin to 0.5 mg 3 times a day with the plan of tapering it off as patient has been misusing or abusing this medication. 3. Patient is encouraged to participate in all unit programming.  Medical Decision Making Problem Points:  Established problem, stable/improving (1) Data Points:  Review and summation of old records (2)  I certify that inpatient services furnished can reasonably be expected to improve the patient's condition.    Randal BubaJanardhaha R Detria Cummings 04/03/2014 1:36 PM

## 2014-04-04 DIAGNOSIS — F329 Major depressive disorder, single episode, unspecified: Secondary | ICD-10-CM | POA: Diagnosis present

## 2014-04-04 DIAGNOSIS — F332 Major depressive disorder, recurrent severe without psychotic features: Principal | ICD-10-CM

## 2014-04-04 DIAGNOSIS — F32A Depression, unspecified: Secondary | ICD-10-CM | POA: Diagnosis present

## 2014-04-04 DIAGNOSIS — F4310 Post-traumatic stress disorder, unspecified: Secondary | ICD-10-CM

## 2014-04-04 MED ORDER — OXYCODONE HCL 10 MG PO TABS: 10.0000 mg | ORAL_TABLET | Freq: Three times a day (TID) | ORAL | Status: DC | PRN

## 2014-04-04 MED ORDER — GABAPENTIN 800 MG PO TABS: 800.0000 mg | ORAL_TABLET | Freq: Three times a day (TID) | ORAL | Status: DC

## 2014-04-04 MED ORDER — LEVOTHYROXINE SODIUM 25 MCG PO TABS: 25.0000 ug | ORAL_TABLET | Freq: Every day | ORAL | Status: DC

## 2014-04-04 MED ORDER — CLONAZEPAM 0.5 MG PO TABS: 0.5000 mg | ORAL_TABLET | Freq: Three times a day (TID) | ORAL | Status: DC

## 2014-04-04 MED ORDER — FLUOXETINE HCL 40 MG PO CAPS: 40.0000 mg | ORAL_CAPSULE | Freq: Every day | ORAL | Status: DC

## 2014-04-04 NOTE — [facility] Group Notes (Signed)
[facility]   04/04/2014 9:44 AM  Participation Quality:  Did not attend group.  [name] [name]

## 2014-04-04 NOTE — Tx Team (Signed)
Interdisciplinary Treatment Plan Update   Date Reviewed:  04/04/2014  Time Reviewed:  9:48 AM  Progress in Treatment:   Attending groups: Yes Participating in groups: Yes Taking medication as prescribed: Yes  Tolerating medication: Yes Family/Significant other contact made:  No, patient declined collateral contact Patient understands diagnosis: Yes  Discussing patient identified problems/goals with staff: Yes Medical problems stabilized or resolved: Yes Denies suicidal/homicidal ideation: Yes Patient has not harmed self or others: Yes  For review of initial/current patient goals, please see plan of care.  Estimated Length of Stay:  Discharge today.  Reasons for Continued Hospitalization:   New Problems/Goals identified:    Discharge Plan or Barriers:   Home [facility]  Additional Comments:  Attendees:  Patient:   [name] 04/04/2014 9:48 AM   Signature: [name], MD 04/04/2014 9:48 AM  Signature:  [name] 04/04/2014 9:48 AM  Signature:   04/04/2014 9:48 AM  Signature:  [name], RN 04/04/2014 9:48 AM  Signature:   04/04/2014 9:48 AM  Signature:  [name], LCSW 04/04/2014 9:48 AM  Signature:   04/04/2014 9:48 AM  Signature:  [name], Care Coordinator [facility] 04/04/2014 9:48 AM  Signature:   04/04/2014 9:48 AM  Signature:  04/04/2014  9:48 AM  Signature:   [name], RN [facility] 04/04/2014  9:48 AM  Signature:   04/04/2014  9:48 AM    Scribe for Treatment Team:   [name],  04/04/2014 9:48 AM

## 2014-04-04 NOTE — Progress Notes (Signed)
[facility] :  Will you be returning to the same living situation after discharge: No.  Patient reports she will stay with her mother. At discharge, do you have transportation home?:Yes,  Patient advised mother will transport her home. Do you have the ability to pay for your medications:No.  Patient needs assistance with indigent medications   Release of information consent forms completed and in the chart;  Patient's signature needed at discharge.  Patient to Follow up at: Follow-up Information   Follow up with [facility] On 04/08/2014. (Walk in on this date for hospital discharge appointment. Walk in clinic is Monday - Friday 8 am - 3 pm. They will than schedule you for medication management and therapy appts. )    Contact information:   [address] [place], [place] [address] Phone: [phone] Fax: [phone]      Patient denies SI/HI:  Patient no longer endorsing SI/HI or other thoughts of self harm.   Safety Planning and Suicide Prevention discussed:  .Reviewed with all patients during discharge planning group   [name] [name] 04/04/2014, 10:47 AM

## 2014-04-04 NOTE — Progress Notes (Signed)
Nrsg  MD completes AM DC  and Pt completed her morning self inventory sheet and on it she writes she denies SI within the past 24 hrs. SHe rated her depression and hopelessness "6-7", respectively and she said her DC plan is to " take my medicine" the way I'm supposed to ....    A  DC AVS is reviewed with pt and pt states understanding and willingness to comply as evidenced by pt's statement : I know what I have to do now...". All belongings are returned to pt and pt is escorted to bldg entrance.
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
## 2014-04-04 NOTE — Progress Notes (Signed)
Patient ID: [name], female   DOB: 07/22/1971, 43 y.o.   MRN: [id number] D: pt. In bed most of the evening only up to get medications, pt. Reports pain at "7" of 10 in lower back. Pt. Denies SHI. A: Writer encouraged pt. To attend group and interact. Staff will monitor q3415min for safety. R: pt. Is safe on the unit, refused karaoke.

## 2014-04-09 NOTE — Progress Notes (Signed)
Patient Discharge Instructions:  After Visit Summary (AVS):   Faxed to:  04/09/14 Discharge Summary Note:   Faxed to:  04/09/14 Psychiatric Admission Assessment Note:   Faxed to:  04/09/14 Suicide Risk Assessment - Discharge Assessment:   Faxed to:  04/09/14 Faxed/Sent to the Next Level Care provider:  04/09/14 [facility] @ [phone]  Jerelene ReddenSheena E [place], 04/09/2014, 3:23 PM

## 2014-05-05 ENCOUNTER — Encounter (HOSPITAL_COMMUNITY): Payer: Self-pay

## 2014-05-05 ENCOUNTER — Encounter (HOSPITAL_COMMUNITY): Payer: Self-pay | Admitting: Emergency Medicine

## 2014-05-05 ENCOUNTER — Emergency Department (HOSPITAL_COMMUNITY): Payer: Medicaid - Out of State

## 2014-05-05 ENCOUNTER — Emergency Department (HOSPITAL_COMMUNITY)
Admission: EM | Admit: 2014-05-05 | Discharge: 2014-05-05 | Disposition: A | Discharge status: Other Acute Inpatient Hospital | Payer: Medicaid - Out of State | Attending: Emergency Medicine | Admitting: Emergency Medicine

## 2014-05-05 ENCOUNTER — Inpatient Hospital Stay (HOSPITAL_COMMUNITY)
Admission: AD | Admit: 2014-05-05 | Discharge: 2014-05-06 | DRG: 897 | Disposition: A | Discharge status: Home / Self Care | Payer: Federal, State, Local not specified - Other | Source: Intra-hospital | Attending: Psychiatry | Admitting: Psychiatry

## 2014-05-05 DIAGNOSIS — F17200 Nicotine dependence, unspecified, uncomplicated: Secondary | ICD-10-CM | POA: Diagnosis present

## 2014-05-05 DIAGNOSIS — F1510 Other stimulant abuse, uncomplicated: Secondary | ICD-10-CM | POA: Insufficient documentation

## 2014-05-05 DIAGNOSIS — Z3202 Encounter for pregnancy test, result negative: Secondary | ICD-10-CM | POA: Insufficient documentation

## 2014-05-05 DIAGNOSIS — G4700 Insomnia, unspecified: Secondary | ICD-10-CM | POA: Diagnosis present

## 2014-05-05 DIAGNOSIS — R45851 Suicidal ideations: Secondary | ICD-10-CM | POA: Insufficient documentation

## 2014-05-05 DIAGNOSIS — F411 Generalized anxiety disorder: Secondary | ICD-10-CM | POA: Diagnosis present

## 2014-05-05 DIAGNOSIS — F1320 Sedative, hypnotic or anxiolytic dependence, uncomplicated: Secondary | ICD-10-CM | POA: Diagnosis present

## 2014-05-05 DIAGNOSIS — F329 Major depressive disorder, single episode, unspecified: Secondary | ICD-10-CM

## 2014-05-05 DIAGNOSIS — G8929 Other chronic pain: Secondary | ICD-10-CM | POA: Insufficient documentation

## 2014-05-05 DIAGNOSIS — M549 Dorsalgia, unspecified: Secondary | ICD-10-CM | POA: Diagnosis present

## 2014-05-05 DIAGNOSIS — F332 Major depressive disorder, recurrent severe without psychotic features: Secondary | ICD-10-CM | POA: Diagnosis present

## 2014-05-05 DIAGNOSIS — F4310 Post-traumatic stress disorder, unspecified: Secondary | ICD-10-CM | POA: Insufficient documentation

## 2014-05-05 DIAGNOSIS — G40909 Epilepsy, unspecified, not intractable, without status epilepticus: Secondary | ICD-10-CM | POA: Insufficient documentation

## 2014-05-05 DIAGNOSIS — F1920 Other psychoactive substance dependence, uncomplicated: Principal | ICD-10-CM

## 2014-05-05 DIAGNOSIS — Z79899 Other long term (current) drug therapy: Secondary | ICD-10-CM | POA: Insufficient documentation

## 2014-05-05 DIAGNOSIS — F1120 Opioid dependence, uncomplicated: Principal | ICD-10-CM | POA: Diagnosis present

## 2014-05-05 DIAGNOSIS — F1310 Sedative, hypnotic or anxiolytic abuse, uncomplicated: Secondary | ICD-10-CM | POA: Insufficient documentation

## 2014-05-05 DIAGNOSIS — F32A Depression, unspecified: Secondary | ICD-10-CM

## 2014-05-05 DIAGNOSIS — IMO0002 Reserved for concepts with insufficient information to code with codable children: Secondary | ICD-10-CM | POA: Insufficient documentation

## 2014-05-05 DIAGNOSIS — Z8781 Personal history of (healed) traumatic fracture: Secondary | ICD-10-CM | POA: Insufficient documentation

## 2014-05-05 DIAGNOSIS — F1110 Opioid abuse, uncomplicated: Secondary | ICD-10-CM | POA: Insufficient documentation

## 2014-05-05 HISTORY — DX: Post-traumatic stress disorder, unspecified: F43.10

## 2014-05-05 LAB — CBC
HCT: 42.3 % (ref 36.0–46.0)
HEMOGLOBIN: 14.5 g/dL (ref 12.0–15.0)
MCH: 32.1 pg (ref 26.0–34.0)
MCHC: 34.3 g/dL (ref 30.0–36.0)
MCV: 93.6 fL (ref 78.0–100.0)
Platelets: 216 10*3/uL (ref 150–400)
RBC: 4.52 MIL/uL (ref 3.87–5.11)
RDW: 15.1 % (ref 11.5–15.5)
WBC: 10.4 10*3/uL (ref 4.0–10.5)

## 2014-05-05 LAB — COMPREHENSIVE METABOLIC PANEL
ALK PHOS: 68 U/L (ref 39–117)
ALT: 11 U/L (ref 0–35)
AST: 11 U/L (ref 0–37)
Albumin: 3.7 g/dL (ref 3.5–5.2)
BUN: 9 mg/dL (ref 6–23)
CHLORIDE: 100 meq/L (ref 96–112)
CO2: 26 mEq/L (ref 19–32)
Calcium: 9.3 mg/dL (ref 8.4–10.5)
Creatinine, Ser: 0.86 mg/dL (ref 0.50–1.10)
GFR calc non Af Amer: 82 mL/min — ABNORMAL LOW (ref >90)
GLUCOSE: 90 mg/dL (ref 70–99)
POTASSIUM: 3.7 meq/L (ref 3.7–5.3)
Sodium: 139 mEq/L (ref 137–147)
TOTAL PROTEIN: 6.9 g/dL (ref 6.0–8.3)
Total Bilirubin: 0.4 mg/dL (ref 0.3–1.2)

## 2014-05-05 LAB — RAPID URINE DRUG SCREEN, HOSP PERFORMED
Amphetamines: POSITIVE — AB
Barbiturates: NOT DETECTED
Benzodiazepines: POSITIVE — AB
Cocaine: NOT DETECTED
OPIATES: POSITIVE — AB
TETRAHYDROCANNABINOL: NOT DETECTED

## 2014-05-05 LAB — ETHANOL

## 2014-05-05 LAB — ACETAMINOPHEN LEVEL

## 2014-05-05 LAB — POC URINE PREG, ED: Preg Test, Ur: NEGATIVE

## 2014-05-05 LAB — SALICYLATE LEVEL: Salicylate Lvl: <2 mg/dL — ABNORMAL LOW (ref 2.8–20.0)

## 2014-05-05 MED ORDER — MAGNESIUM HYDROXIDE 400 MG/5ML PO SUSP: 30.0000 mL | Freq: Every day | ORAL | Status: DC | PRN

## 2014-05-05 MED ORDER — OXYCODONE-ACETAMINOPHEN 5-325 MG PO TABS
1.0000 | ORAL_TABLET | Freq: Four times a day (QID) | ORAL | Status: DC
  Administered 2014-05-05 (×2): 1 via ORAL
  Filled 2014-05-05 (×3): qty 1

## 2014-05-05 MED ORDER — FLUOXETINE HCL 20 MG PO CAPS
40.0000 mg | ORAL_CAPSULE | Freq: Every day | ORAL | Status: DC
  Filled 2014-05-05 (×3): qty 2

## 2014-05-05 MED ORDER — OXYCODONE HCL 5 MG PO TABS
5.0000 mg | ORAL_TABLET | Freq: Four times a day (QID) | ORAL | Status: DC
  Administered 2014-05-05 (×2): 5 mg via ORAL
  Filled 2014-05-05 (×3): qty 1

## 2014-05-05 MED ORDER — GABAPENTIN 400 MG PO CAPS
800.0000 mg | ORAL_CAPSULE | Freq: Three times a day (TID) | ORAL | Status: DC
  Administered 2014-05-05 (×2): 800 mg via ORAL
  Filled 2014-05-05 (×5): qty 2

## 2014-05-05 MED ORDER — NICOTINE 21 MG/24HR TD PT24
21.0000 mg | MEDICATED_PATCH | Freq: Every day | TRANSDERMAL | Status: DC
  Filled 2014-05-05 (×3): qty 1

## 2014-05-05 MED ORDER — ACETAMINOPHEN 325 MG PO TABS: 650.0000 mg | ORAL_TABLET | Freq: Four times a day (QID) | ORAL | Status: DC | PRN

## 2014-05-05 MED ORDER — OXYCODONE-ACETAMINOPHEN 10-325 MG PO TABS: 1.0000 | ORAL_TABLET | Freq: Four times a day (QID) | ORAL | Status: DC

## 2014-05-05 MED ORDER — ALUM & MAG HYDROXIDE-SIMETH 200-200-20 MG/5ML PO SUSP: 30.0000 mL | ORAL | Status: DC | PRN

## 2014-05-05 MED ORDER — KETOROLAC TROMETHAMINE 60 MG/2ML IM SOLN: 60.0000 mg | Freq: Once | INTRAMUSCULAR | Status: DC

## 2014-05-05 MED ORDER — NICOTINE 21 MG/24HR TD PT24
21.0000 mg | MEDICATED_PATCH | Freq: Every day | TRANSDERMAL | Status: DC
  Administered 2014-05-05: 21 mg via TRANSDERMAL
  Filled 2014-05-05: qty 1

## 2014-05-05 MED ORDER — LORAZEPAM 1 MG PO TABS
1.0000 mg | ORAL_TABLET | Freq: Three times a day (TID) | ORAL | Status: DC | PRN
  Administered 2014-05-05 (×2): 1 mg via ORAL
  Filled 2014-05-05 (×2): qty 1

## 2014-05-05 MED ORDER — KETOROLAC TROMETHAMINE 60 MG/2ML IM SOLN
60.0000 mg | Freq: Once | INTRAMUSCULAR | Status: AC
  Administered 2014-05-05: 60 mg via INTRAMUSCULAR
  Filled 2014-05-05: qty 2

## 2014-05-05 MED ORDER — IBUPROFEN 600 MG PO TABS
600.0000 mg | ORAL_TABLET | Freq: Three times a day (TID) | ORAL | Status: DC | PRN
  Filled 2014-05-05: qty 1

## 2014-05-05 MED ORDER — ZOLPIDEM TARTRATE 5 MG PO TABS: 5.0000 mg | ORAL_TABLET | Freq: Every evening | ORAL | Status: DC | PRN

## 2014-05-05 MED ORDER — IBUPROFEN 200 MG PO TABS: 600.0000 mg | ORAL_TABLET | Freq: Three times a day (TID) | ORAL | Status: DC | PRN

## 2014-05-05 MED ORDER — LEVOTHYROXINE SODIUM 25 MCG PO TABS
25.0000 ug | ORAL_TABLET | Freq: Every day | ORAL | Status: DC
  Administered 2014-05-06: 25 ug via ORAL
  Filled 2014-05-05 (×4): qty 1

## 2014-05-05 MED ORDER — FLUOXETINE HCL 20 MG PO CAPS
40.0000 mg | ORAL_CAPSULE | Freq: Every day | ORAL | Status: DC
  Administered 2014-05-05: 40 mg via ORAL
  Filled 2014-05-05: qty 2

## 2014-05-05 MED ORDER — ONDANSETRON HCL 4 MG PO TABS: 4.0000 mg | ORAL_TABLET | Freq: Three times a day (TID) | ORAL | Status: DC | PRN

## 2014-05-05 MED ORDER — HYDROXYZINE HCL 25 MG PO TABS
25.0000 mg | ORAL_TABLET | Freq: Four times a day (QID) | ORAL | Status: DC | PRN
  Administered 2014-05-05: 25 mg via ORAL
  Filled 2014-05-05: qty 1

## 2014-05-05 MED ORDER — LEVOTHYROXINE SODIUM 25 MCG PO TABS
25.0000 ug | ORAL_TABLET | Freq: Every day | ORAL | Status: DC
  Administered 2014-05-05: 25 ug via ORAL
  Filled 2014-05-05 (×2): qty 1

## 2014-05-05 MED ORDER — TRAZODONE HCL 100 MG PO TABS
100.0000 mg | ORAL_TABLET | Freq: Every day | ORAL | Status: DC
  Filled 2014-05-05 (×4): qty 1

## 2014-05-05 MED ORDER — GABAPENTIN 400 MG PO CAPS
800.0000 mg | ORAL_CAPSULE | Freq: Three times a day (TID) | ORAL | Status: DC
  Administered 2014-05-06: 800 mg via ORAL
  Filled 2014-05-05 (×7): qty 2

## 2014-05-05 NOTE — ED Notes (Signed)
Bed: [id number] Expected date:  Expected time:  Means of arrival:  Comments: EMS/SI

## 2014-05-05 NOTE — ED Notes (Signed)
Oxycodone not given- pt not on floor-

## 2014-05-05 NOTE — Progress Notes (Signed)
[facility] provided pt with a [employer] Orange Card application, highlighting [facility], to help patient establish primary care.  °

## 2014-05-05 NOTE — ED Provider Notes (Signed)
Medical screening examination/treatment/procedure(s) were performed by non-physician practitioner and as supervising physician I was immediately available for consultation/collaboration.   EKG Interpretation None       Derwood KaplanAnkit Vaidehi Braddy, MD 05/05/14 [phone]

## 2014-05-05 NOTE — Progress Notes (Signed)
  CARE MANAGEMENT ED NOTE 05/05/2014  Patient:  [name],[name]   Account Number:  [id number]  Date Initiated:  05/05/2014  Documentation initiated by:  Edd ArbourGIBBS,Gurman [place]  Subjective/Objective Assessment:   [age] y rold [place] with 2 ED visits  and 1 admission.  Dx major depression, recurrent severe and PTSD     Subjective/Objective Assessment Detail:   positive for benzo, opiates and amphetamines  Recent d[facility]cfrom [facility] on 04/04/14     Action/Plan:   Cm spoke with pt after [facility] staff spoke with her and encouraged use of resources provided Discussed she will be asked about her pcp [facility]   Action/Plan Detail:   Anticipated DC Date:       Status Recommendation to Physician:   Result of Recommendation:    Other ED Services  Consult Working Plan    DC Planning Services  Other  PCP issues  Other    Choice offered to / List presented to:            Status of service:  Completed, signed off  ED Comments:   ED Comments Detail:

## 2014-05-05 NOTE — ED Notes (Signed)
Pt c/o cough with sinus congestion x 3 days; MD notified, order received for chest x-ray to rule out pneumonia.
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
## 2014-05-05 NOTE — ED Notes (Signed)
Patient and pt's belongings wanded by security

## 2014-05-05 NOTE — ED Notes (Signed)
Patient up to bathroom to provide urine specimen Patient ambulates to bathroom without difficulty or assistance Patient with c/o chronic back pain which she rates 8/10--will medicate, see MAR

## 2014-05-05 NOTE — Tx Team (Signed)
Initial Interdisciplinary Treatment Plan  PATIENT STRENGTHS: (choose at least two) Average or above average intelligence Capable of independent living Communication skills Supportive family/friends  PATIENT STRESSORS: Medication change or noncompliance   PROBLEM LIST: Problem List/Patient Goals Date to be addressed Date deferred Reason deferred Estimated date of resolution  Coping skills for Depression with SI      anxiety      PTSD                                           DISCHARGE CRITERIA:  Improved stabilization in mood, thinking, and/or behavior Motivation to continue treatment in a less acute level of care Verbal commitment to aftercare and medication compliance  PRELIMINARY DISCHARGE PLAN: Outpatient therapy Return to previous living arrangement  PATIENT/FAMIILY INVOLVEMENT: This treatment plan has been presented to and reviewed with the patient, [name], and/or family member, .  The patient and family have been given the opportunity to ask questions and make suggestions.  [name] 05/05/2014, 7:24 PM

## 2014-05-05 NOTE — ED Notes (Addendum)
Patient belongings: Bearl MulberryBrown clogs Pair of white socks Red bra Blue button up shirt Blue pants Red purse--four packs of cigarettes, black wallet, lighter, lipstick, mascara, hair clip, box of nasal decongestant medication, blister pack of anti-diarrheal medication

## 2014-05-05 NOTE — BH Assessment (Signed)
Clinician consulted with Alberteen SamFran Hobson, NP who recommends pt for inpatient admission. Joann, AC reports no bed availability. TTS will seek alternative placement. Clinician provided updates to BeverlyHannah, GeorgiaPA.  Yaakov Guthrieelilah Stewart, MSW, LCSW Triage Specialist [phone]

## 2014-05-05 NOTE — ED Notes (Signed)
PA at bedside.

## 2014-05-05 NOTE — ED Notes (Signed)
Per EMS pt presents d/t SI w/o a plan.  Pt reported to EMS that she has non-compliant w/ her SSRI since Friday.

## 2014-05-05 NOTE — ED Notes (Signed)
Attempted to given gabapentin- locked by Ala DachFord NP

## 2014-05-05 NOTE — Progress Notes (Signed)
D   Pt has been in her bed resting with her eyes closed   She complained of pain but when offered ibuprophen for pain she refused it and said she could not take it but nothing is listed in her allergies    She is irritable and has isolated all evening    A   Verbal support given   Medications administered and effectiveness monitored   Q 15 min checks  Continue to offer alternatives to narcotics for pain and educate on same R   Pt safe at present
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
## 2014-05-05 NOTE — ED Notes (Signed)
Bed: WA29 Expected date:  Expected time:  Means of arrival:  Comments: Hold- Triage 3

## 2014-05-05 NOTE — BH Assessment (Signed)
Clinician contacted Dahlia ClientHannah, [place] working with pt to gather report. Pt reports SI with plan. Pt feeling depressed and stated she is out of meds.  Pt to be assessed.   Yaakov Guthrieelilah Stewart, MSW, LCSW Triage Specialist [phone]

## 2014-05-06 DIAGNOSIS — F4310 Post-traumatic stress disorder, unspecified: Secondary | ICD-10-CM

## 2014-05-06 DIAGNOSIS — F1120 Opioid dependence, uncomplicated: Secondary | ICD-10-CM

## 2014-05-06 DIAGNOSIS — F332 Major depressive disorder, recurrent severe without psychotic features: Secondary | ICD-10-CM

## 2014-05-06 DIAGNOSIS — F1320 Sedative, hypnotic or anxiolytic dependence, uncomplicated: Secondary | ICD-10-CM

## 2014-05-06 MED ORDER — CLONIDINE HCL 0.1 MG PO TABS
0.1000 mg | ORAL_TABLET | Freq: Four times a day (QID) | ORAL | Status: DC
  Administered 2014-05-06: 0.1 mg via ORAL
  Filled 2014-05-06 (×9): qty 1

## 2014-05-06 MED ORDER — FLUOXETINE HCL 40 MG PO CAPS: 40.0000 mg | ORAL_CAPSULE | Freq: Every day | ORAL | Status: AC

## 2014-05-06 MED ORDER — CLONIDINE HCL 0.1 MG PO TABS: 0.1000 mg | ORAL_TABLET | Freq: Every day | ORAL | Status: DC

## 2014-05-06 MED ORDER — CLONIDINE HCL 0.1 MG PO TABS
0.1000 mg | ORAL_TABLET | ORAL | Status: DC
  Filled 2014-05-06: qty 1

## 2014-05-06 MED ORDER — GABAPENTIN 800 MG PO TABS: 800.0000 mg | ORAL_TABLET | Freq: Three times a day (TID) | ORAL | Status: AC

## 2014-05-06 MED ORDER — CHLORDIAZEPOXIDE HCL 25 MG PO CAPS: 25.0000 mg | ORAL_CAPSULE | Freq: Every day | ORAL | Status: DC

## 2014-05-06 MED ORDER — CHLORDIAZEPOXIDE HCL 25 MG PO CAPS: 25.0000 mg | ORAL_CAPSULE | Freq: Three times a day (TID) | ORAL | Status: DC

## 2014-05-06 MED ORDER — ONDANSETRON 4 MG PO TBDP: 4.0000 mg | ORAL_TABLET | Freq: Four times a day (QID) | ORAL | Status: DC | PRN

## 2014-05-06 MED ORDER — LEVOTHYROXINE SODIUM 25 MCG PO TABS: 25.0000 ug | ORAL_TABLET | Freq: Every day | ORAL | Status: AC

## 2014-05-06 MED ORDER — DICYCLOMINE HCL 20 MG PO TABS: 20.0000 mg | ORAL_TABLET | Freq: Four times a day (QID) | ORAL | Status: DC | PRN

## 2014-05-06 MED ORDER — CHLORDIAZEPOXIDE HCL 25 MG PO CAPS: 25.0000 mg | ORAL_CAPSULE | ORAL | Status: DC

## 2014-05-06 MED ORDER — METHOCARBAMOL 500 MG PO TABS: 500.0000 mg | ORAL_TABLET | Freq: Three times a day (TID) | ORAL | Status: DC | PRN

## 2014-05-06 MED ORDER — CLONIDINE HCL 0.1 MG PO TABS: 0.1000 mg | ORAL_TABLET | Freq: Four times a day (QID) | ORAL | Status: AC

## 2014-05-06 MED ORDER — CHLORDIAZEPOXIDE HCL 25 MG PO CAPS: 25.0000 mg | ORAL_CAPSULE | Freq: Four times a day (QID) | ORAL | Status: DC | PRN

## 2014-05-06 MED ORDER — VITAMIN B-1 100 MG PO TABS
100.0000 mg | ORAL_TABLET | Freq: Every day | ORAL | Status: DC
  Filled 2014-05-06 (×2): qty 1

## 2014-05-06 MED ORDER — NAPROXEN 500 MG PO TABS: 500.0000 mg | ORAL_TABLET | Freq: Two times a day (BID) | ORAL | Status: DC | PRN

## 2014-05-06 MED ORDER — ADULT MULTIVITAMIN W/MINERALS CH
1.0000 | ORAL_TABLET | Freq: Every day | ORAL | Status: DC
  Administered 2014-05-06: 1 via ORAL
  Filled 2014-05-06 (×3): qty 1

## 2014-05-06 MED ORDER — THIAMINE HCL 100 MG/ML IJ SOLN: 100.0000 mg | Freq: Once | INTRAMUSCULAR | Status: DC

## 2014-05-06 MED ORDER — CHLORDIAZEPOXIDE HCL 25 MG PO CAPS
25.0000 mg | ORAL_CAPSULE | Freq: Four times a day (QID) | ORAL | Status: DC
  Administered 2014-05-06: 25 mg via ORAL
  Filled 2014-05-06: qty 1

## 2014-05-06 MED ORDER — LOPERAMIDE HCL 2 MG PO CAPS: 2.0000 mg | ORAL_CAPSULE | ORAL | Status: DC | PRN
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
## 2014-05-06 NOTE — Progress Notes (Signed)
[facility] Discharge Plan :  Will you be returning to the same living situation after discharge: Yes,  home with mom At discharge, do you have transportation home?:Yes,  pt's mother Do you have the ability to pay for your medications:Yes,  Medicaid (out of state)  Release of information consent forms completed and submitted to Medical Records by CSW.  Patient to Follow up at: Follow-up Information   Follow up with patient refused all followup. .    PT refused all services and was d/ced within 24 hours.   Patient denies SI/HI:   Yes,  self report.     Safety Planning and Suicide Prevention discussed:  Yes,  Pt refused to consent to family contact. SPI pamphlet provided to pt and SPE completed with pt.   Sina Sumpter Smart LCSWA 05/06/2014, 2:28 PM
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
## 2014-05-06 NOTE — [facility] Suicide Risk Assessment (Signed)
[facility] INPATIENT:  Family/Significant Other Suicide Prevention Education  Suicide Prevention Education:  Patient Refusal for Family/Significant Other Suicide Prevention Education: The patient [name] has refused to provide written consent for family/significant other to be provided Family/Significant Other Suicide Prevention Education during admission and/or prior to discharge.  Physician notified.  SPE completed with pt. SPI pamphlet provided to pt and she was encouraged to share information with support network, ask questions, and talk about any concerns relating to SPE.  Chan Rosasco Smart LCSWA  05/06/2014, 2:28 PM

## 2014-05-06 NOTE — Progress Notes (Signed)
Patient ID: [name], female   DOB: 04/07/1971, 43 y.o.   MRN: [id number] She has been  Discharged home and was picked up by her mother. She voiced understanding of discharge instruction and  She refused follow up care from here. She was going to see her MD.   All belongings were taken home with her. She denies thoughts of wanting to harm herself.

## 2014-05-08 NOTE — Progress Notes (Signed)
Patient Discharge Instructions:  No documentation was faxed for HBIPs.  Per the SW the patient refused follow up.  Susan ReddenSheena E Murrells [name], 05/08/2014, 1:50 PM

## 2014-06-25 DEATH — deceased
